# Patient Record
Sex: Female | Born: 1995 | Race: White | Hispanic: No | Marital: Single | Smoking: Never smoker
Health system: Southern US, Community
[De-identification: ages and names within clinical notes are randomized; demographics above are authoritative.]

## PROBLEM LIST (undated history)

## (undated) DIAGNOSIS — I10 Essential (primary) hypertension: Secondary | ICD-10-CM

## (undated) DIAGNOSIS — E785 Hyperlipidemia, unspecified: Secondary | ICD-10-CM

## (undated) DIAGNOSIS — G43909 Migraine, unspecified, not intractable, without status migrainosus: Secondary | ICD-10-CM

## (undated) DIAGNOSIS — F909 Attention-deficit hyperactivity disorder, unspecified type: Secondary | ICD-10-CM

## (undated) DIAGNOSIS — E119 Type 2 diabetes mellitus without complications: Secondary | ICD-10-CM

## (undated) DIAGNOSIS — E559 Vitamin D deficiency, unspecified: Secondary | ICD-10-CM

## (undated) HISTORY — DX: Migraine, unspecified, not intractable, without status migrainosus: G43.909

## (undated) HISTORY — DX: Hyperlipidemia, unspecified: E78.5

## (undated) HISTORY — DX: Type 2 diabetes mellitus without complications: E11.9

## (undated) HISTORY — PX: NO PAST SURGERIES: SHX2092

## (undated) HISTORY — DX: Vitamin D deficiency, unspecified: E55.9

---
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
## 2016-05-11 ENCOUNTER — Emergency Department (HOSPITAL_COMMUNITY)
Admission: EM | Admit: 2016-05-11 | Discharge: 2016-05-11 | Disposition: A | Discharge status: Home / Self Care | Payer: Medicaid Other | Attending: Emergency Medicine | Admitting: Emergency Medicine

## 2016-05-11 ENCOUNTER — Emergency Department (HOSPITAL_COMMUNITY): Payer: Medicaid Other

## 2016-05-11 ENCOUNTER — Encounter (HOSPITAL_COMMUNITY): Payer: Self-pay | Admitting: Emergency Medicine

## 2016-05-11 DIAGNOSIS — Z79899 Other long term (current) drug therapy: Secondary | ICD-10-CM | POA: Diagnosis not present

## 2016-05-11 DIAGNOSIS — I10 Essential (primary) hypertension: Secondary | ICD-10-CM | POA: Diagnosis not present

## 2016-05-11 DIAGNOSIS — F909 Attention-deficit hyperactivity disorder, unspecified type: Secondary | ICD-10-CM | POA: Insufficient documentation

## 2016-05-11 DIAGNOSIS — R0789 Other chest pain: Secondary | ICD-10-CM | POA: Diagnosis present

## 2016-05-11 DIAGNOSIS — R079 Chest pain, unspecified: Secondary | ICD-10-CM

## 2016-05-11 LAB — CBC
HEMATOCRIT: 38.7 % (ref 36.0–46.0)
HEMOGLOBIN: 13.1 g/dL (ref 12.0–15.0)
MCH: 30.9 pg (ref 26.0–34.0)
MCHC: 33.9 g/dL (ref 30.0–36.0)
MCV: 91.3 fL (ref 78.0–100.0)
Platelets: 279 10*3/uL (ref 150–400)
RBC: 4.24 MIL/uL (ref 3.87–5.11)
RDW: 12.8 % (ref 11.5–15.5)
WBC: 11.9 10*3/uL — ABNORMAL HIGH (ref 4.0–10.5)

## 2016-05-11 LAB — BASIC METABOLIC PANEL
ANION GAP: 8 (ref 5–15)
BUN: 15 mg/dL (ref 6–20)
CALCIUM: 9.1 mg/dL (ref 8.9–10.3)
CO2: 25 mmol/L (ref 22–32)
Chloride: 103 mmol/L (ref 101–111)
Creatinine, Ser: 0.97 mg/dL (ref 0.44–1.00)
GFR calc non Af Amer: >60 mL/min (ref >60)
Glucose, Bld: 182 mg/dL — ABNORMAL HIGH (ref 65–99)
POTASSIUM: 3.9 mmol/L (ref 3.5–5.1)
Sodium: 136 mmol/L (ref 135–145)

## 2016-05-11 LAB — TROPONIN I: Troponin I: <0.03 ng/mL (ref <0.03)

## 2016-05-11 MED ORDER — IBUPROFEN 400 MG PO TABS: 400.0000 mg | ORAL_TABLET | Freq: Four times a day (QID) | ORAL | 0 refills | Status: AC | PRN

## 2016-05-11 MED ORDER — ONDANSETRON 4 MG PO TBDP
4.0000 mg | ORAL_TABLET | Freq: Once | ORAL | Status: AC
  Administered 2016-05-11: 4 mg via ORAL
  Filled 2016-05-11: qty 1

## 2016-05-11 MED ORDER — IBUPROFEN 800 MG PO TABS
800.0000 mg | ORAL_TABLET | Freq: Once | ORAL | Status: AC
Start: 2016-05-11 — End: 2016-05-11
  Administered 2016-05-11: 800 mg via ORAL
  Filled 2016-05-11: qty 1

## 2016-05-11 NOTE — ED Provider Notes (Signed)
[facility]-EMERGENCY DEPT Provider Note   CSN: [id number] Arrival date & time: 05/11/16  [id number]     History   Chief Complaint Chief Complaint  Patient presents with  . Chest Pain    HPI Bertram SavinSantashia C Puskarich is a 20 y.o. female.  HPI  The patient is a 20 year old female, she has a history of hypertension since the age of 20 and has had chest pain since that age as well. She reports that over the last couple of days she has had increasing frequency of chest pain which is parasternal, sometimes left, sometimes right parasternal, does not seem to be worse with breathing or position, may be palpation worsening it. She has no exertional symptoms, she has never had coronary disease or heart disease, denies fevers chills nausea vomiting shortness of breath or swelling of the legs. She has been taking her medications including Norvasc as prescribed without difficulty.  Past Medical History:  Diagnosis Date  . ADHD (attention deficit hyperactivity disorder)   . Hypertension     There are no active problems to display for this patient.   History reviewed. No pertinent surgical history.  OB History    No data available       Home Medications    Prior to Admission medications   Medication Sig Start Date End Date Taking? Authorizing Provider  amLODipine (NORVASC) 10 MG tablet Take 10 mg by mouth daily.    Historical Provider, MD  cloNIDine (CATAPRES) 0.1 MG tablet Take 0.2 mg by mouth at bedtime.     Historical Provider, MD  ibuprofen (ADVIL,MOTRIN) 400 MG tablet Take 1 tablet (400 mg total) by mouth every 6 (six) hours as needed. 05/11/16   Eber HongBrian Tamorah Hada, MD  methylphenidate (CONCERTA) 54 MG CR tablet Take 54 mg by mouth every morning.    Historical Provider, MD    Family History Family History  Problem Relation Age of Onset  . Family history unknown: Yes    Social History Social History  Substance Use Topics  . Smoking status: Never Smoker  . Smokeless tobacco: Never Used  . Alcohol  use No     Allergies   Augmentin [amoxicillin-pot clavulanate]   Review of Systems Review of Systems  All other systems reviewed and are negative.    Physical Exam Updated Vital Signs BP 125/79 (BP Location: Left Arm)   Pulse 106   Temp 98.1 F (36.7 C)   Resp 18   Ht 5\' 4"  (1.626 m)   Wt 198 lb (89.8 kg)   LMP 04/10/2016   SpO2 100%   BMI 33.99 kg/m   Physical Exam  Constitutional: She appears well-developed and well-nourished. No distress.  HENT:  Head: Normocephalic and atraumatic.  Mouth/Throat: Oropharynx is clear and moist. No oropharyngeal exudate.  Eyes: Conjunctivae and EOM are normal. Pupils are equal, round, and reactive to light. Right eye exhibits no discharge. Left eye exhibits no discharge. No scleral icterus.  Neck: Normal range of motion. Neck supple. No JVD present. No thyromegaly present.  Cardiovascular: Normal rate, regular rhythm, normal heart sounds and intact distal pulses.  Exam reveals no gallop and no friction rub.   No murmur heard. Pulmonary/Chest: Effort normal and breath sounds normal. No respiratory distress. She has no wheezes. She has no rales. She exhibits tenderness ( Female chaperone present for exam, direct tenderness over this sternum and parasternal tissue).  Abdominal: Soft. Bowel sounds are normal. She exhibits no distension and no mass. There is no tenderness.  Musculoskeletal: Normal range  of motion. She exhibits no edema or tenderness.  Lymphadenopathy:    She has no cervical adenopathy.  Neurological: She is alert. Coordination normal.  Skin: Skin is warm and dry. No rash noted. No erythema.  Psychiatric: She has a normal mood and affect. Her behavior is normal.  Nursing note and vitals reviewed.    ED Treatments / Results  Labs (all labs ordered are listed, but only abnormal results are displayed) Labs Reviewed  BASIC METABOLIC PANEL - Abnormal; Notable for the following:       Result Value   Glucose, Bld 182 (*)      All other components within normal limits  CBC - Abnormal; Notable for the following:    WBC 11.9 (*)    All other components within normal limits  TROPONIN I    EKG  EKG Interpretation  Date/Time:  Monday May 11 2016 19:08:20 EDT Ventricular Rate:  102 PR Interval:    QRS Duration: 90 QT Interval:  330 QTC Calculation: 430 R Axis:   60 Text Interpretation:  Sinus tachycardia Baseline wander in lead(s) II III aVF Since last tracing rate faster Confirmed by Soley Harriss  MD, Yomar Mejorado ([id number]) on 05/11/2016 7:33:54 PM       Radiology Dg Chest 2 View  Result Date: 05/11/2016 CLINICAL DATA:  Centralized chest pressure beginning this morning and worsening throughout the day. EXAM: CHEST  2 VIEW COMPARISON:  PA and lateral chest 02/04/2010. FINDINGS: Lungs are clear. Heart size is normal. No pneumothorax or pleural effusion. No bony abnormality. IMPRESSION: Negative chest. Electronically Signed   By: Drusilla Kanner M.D.   On: 05/11/2016 20:45    Procedures Procedures (including critical care time)  Medications Ordered in ED Medications  ibuprofen (ADVIL,MOTRIN) tablet 800 mg (800 mg Oral Given 05/11/16 1934)  ondansetron (ZOFRAN-ODT) disintegrating tablet 4 mg (4 mg Oral Given 05/11/16 2010)     Initial Impression / Assessment and Plan / ED Course  I have reviewed the triage vital signs and the nursing notes.  Pertinent labs & imaging results that were available during my care of the patient were reviewed by me and considered in my medical decision making (see chart for details).  Clinical Course    The patient does have reproducible tenderness over her sternum, no other abnormal findings on exam. She has no risk factors for pulmonary embolism including travel, trauma, immobilization, swelling, birth control, smoking. She has no other risk factors for coronary disease except for hypertension which is extremely well controlled as her blood pressure is 120/80 in the room.  Labs  and xray and ecg and trop normal - motrin and home, doubt PE or ACS  Final Clinical Impressions(s) / ED Diagnoses   Final diagnoses:  Chest pain, unspecified type    New Prescriptions New Prescriptions   IBUPROFEN (ADVIL,MOTRIN) 400 MG TABLET    Take 1 tablet (400 mg total) by mouth every 6 (six) hours as needed.     Eber Hong, MD 05/11/16 2056

## 2016-05-11 NOTE — ED Triage Notes (Signed)
Pt reports centralized chest pressure that started this am, worsening through the day.

## 2016-05-11 NOTE — Discharge Instructions (Signed)

## 2018-08-30 ENCOUNTER — Encounter (INDEPENDENT_AMBULATORY_CARE_PROVIDER_SITE_OTHER): Payer: Self-pay | Admitting: Internal Medicine

## 2018-08-30 ENCOUNTER — Ambulatory Visit (INDEPENDENT_AMBULATORY_CARE_PROVIDER_SITE_OTHER): Payer: Self-pay | Admitting: Internal Medicine

## 2018-08-30 ENCOUNTER — Other Ambulatory Visit (INDEPENDENT_AMBULATORY_CARE_PROVIDER_SITE_OTHER): Payer: Self-pay | Admitting: Internal Medicine

## 2018-08-30 VITALS — BP 139/89 | HR 111 | Temp 98.0°F | Ht 64.0 in | Wt 206.5 lb

## 2018-08-30 DIAGNOSIS — K219 Gastro-esophageal reflux disease without esophagitis: Secondary | ICD-10-CM

## 2018-08-30 DIAGNOSIS — K5900 Constipation, unspecified: Secondary | ICD-10-CM

## 2018-08-30 DIAGNOSIS — K921 Melena: Secondary | ICD-10-CM

## 2018-08-30 DIAGNOSIS — R197 Diarrhea, unspecified: Secondary | ICD-10-CM

## 2018-08-30 MED ORDER — POLYETHYLENE GLYCOL 3350 17 GM/SCOOP PO POWD: 1.0000 | Freq: Every day | ORAL | 3 refills | Status: DC

## 2018-08-30 MED ORDER — OMEPRAZOLE 20 MG PO CPDR: 20.0000 mg | DELAYED_RELEASE_CAPSULE | Freq: Every day | ORAL | 3 refills | Status: DC

## 2018-08-30 NOTE — Progress Notes (Signed)
   Subjective:    Patient ID: Monica Jennings, female    DOB: [birth year], 23 y.o.   MRN: [id number]  HPI Referred by Dr. Wende Crease for loose stools, blood in stools. Has seen blood a couple of times about a month ago. Her stools are loose. Loose stools for months. She says she has constipation. Has a BM about once a week. Her symptoms started about 2 months ago. She saw blood when she strained. Has taken Miralax in the past for constipation.  She says her BMs are hard. She alternates between soft stools and constipation. She says her stomach hurts with anything she eats. Nothing for the GERD. GERD symptoms for years.  Her appetite is okay. No weight loss.  Symptoms worse when she eats dairy.  No family hx of Crohn's disease. Periods are very irregular     Review of Systems Past Medical History:  Diagnosis Date  . ADHD (attention deficit hyperactivity disorder)   . Hypertension     No past surgical history on file.  Allergies  Allergen Reactions  . Augmentin [Amoxicillin-Pot Clavulanate]     Nausea and vomiting    Current Outpatient Medications on File Prior to Visit  Medication Sig Dispense Refill  . amLODipine (NORVASC) 10 MG tablet Take 10 mg by mouth daily.    Marland Kitchen ibuprofen (ADVIL,MOTRIN) 400 MG tablet Take 1 tablet (400 mg total) by mouth every 6 (six) hours as needed. 30 tablet 0  . Multiple Vitamin (MULTIVITAMIN) tablet Take 1 tablet by mouth daily.     No current facility-administered medications on file prior to visit.         Objective:   Physical Exam Blood pressure 139/89, pulse (!) 111, temperature 98 F (36.7 C), height 5\' 4"  (1.626 m), weight 206 lb 8 oz (93.7 kg).  Alert and oriented. Skin warm and dry. Oral mucosa is moist.   . Sclera anicteric, conjunctivae is pink. Thyroid not enlarged. No cervical lymphadenopathy. Lungs clear. Heart regular rate and rhythm.  Abdomen is soft. Bowel sounds are positive. No hepatomegaly. No abdominal masses felt. No  tenderness.  No edema to lower extremities.         Assessment & Plan:  Constipation. Miralax daily. Am going to get an IBD panel. Further  Recommendations to follow.  Blood in stool while constipated. 3 stools cards home with patient.  GERD. Rx for Omeprazole sent to her pharmacy. GERD diet given to patient.

## 2018-08-30 NOTE — Patient Instructions (Signed)
IBD panel.

## 2018-09-02 LAB — INFLAM. BOWEL DISEASE DIFF. PANEL
ANCA SCREEN: NEGATIVE
SACCHAROMYCES CEREVISIAE AB (ASCA)(IGA): 6.2 U (ref <=20.0)
SACCHAROMYCES CEREVISIAE AB (ASCA)(IGG): 10.1 U (ref <=20.0)

## 2018-10-25 ENCOUNTER — Encounter (INDEPENDENT_AMBULATORY_CARE_PROVIDER_SITE_OTHER): Payer: Self-pay | Admitting: Internal Medicine

## 2018-10-25 ENCOUNTER — Ambulatory Visit (INDEPENDENT_AMBULATORY_CARE_PROVIDER_SITE_OTHER): Payer: Medicaid Other | Admitting: Internal Medicine

## 2018-10-25 ENCOUNTER — Other Ambulatory Visit: Payer: Self-pay

## 2018-10-25 VITALS — BP 118/69 | HR 94 | Temp 97.8°F | Ht 64.0 in | Wt 204.8 lb

## 2018-10-25 DIAGNOSIS — K219 Gastro-esophageal reflux disease without esophagitis: Secondary | ICD-10-CM

## 2018-10-25 DIAGNOSIS — K5900 Constipation, unspecified: Secondary | ICD-10-CM | POA: Diagnosis not present

## 2018-10-25 NOTE — Progress Notes (Signed)
   Subjective:    Patient ID: Monica Jennings, female    DOB: 1996-04-15, 23 y.o.   MRN: [id number]  HPI Here today for f/u. Last seen in January of this year as a new referral. Had been having loose stools. Had seen some blood in her stools about 2 months ago. Saw blood when she strained to have a BM. She alternates between constipation and soft stools.  At OV she said she was constipated. Having a BM x 1 a weeks. C/o GERD. Symptoms worse with dairy. No family hx of Crohn's disease. She was advised to take Miralax daily. IBD panel ordered but was not drawn. Three stools cards sent home with patient (not returned). GERD diet given to patient. Started on Omeprazole.  She tells me she is pregnant. She is 4 1/2 months pregnant.Her stools are better since starting the Miralax. She does have some problems with her constipation.  Appetite is good. GERD controlled with the Omeprazole.      Review of Systems Past Medical History:  Diagnosis Date  . ADHD (attention deficit hyperactivity disorder)   . Hypertension     History reviewed. No pertinent surgical history.  Allergies  Allergen Reactions  . Augmentin [Amoxicillin-Pot Clavulanate]     Nausea and vomiting    Current Outpatient Medications on File Prior to Visit  Medication Sig Dispense Refill  . ibuprofen (ADVIL,MOTRIN) 400 MG tablet Take 1 tablet (400 mg total) by mouth every 6 (six) hours as needed. 30 tablet 0  . labetalol (NORMODYNE) 100 MG tablet Take by mouth once.    . metFORMIN (GLUCOPHAGE) 500 MG tablet Take by mouth 3 (three) times daily.    . Multiple Vitamin (MULTIVITAMIN) tablet Take 1 tablet by mouth daily.    Marland Kitchen omeprazole (PRILOSEC) 20 MG capsule Take 1 capsule (20 mg total) by mouth daily. 90 capsule 3  . polyethylene glycol powder (GLYCOLAX/MIRALAX) powder Take 255 g by mouth daily. 255 g 3   No current facility-administered medications on file prior to visit.         Objective:   Physical Exam Blood pressure  118/69, pulse 94, temperature 97.8 F (36.6 C), height 5\' 4"  (1.626 m), weight 204 lb 12.8 oz (92.9 kg). Alert and oriented. Skin warm and dry. Oral mucosa is moist.   . Sclera anicteric, conjunctivae is pink. Thyroid not enlarged. No cervical lymphadenopathy. Lungs clear. Heart regular rate and rhythm.  Abdomen is soft. Bowel sounds are positive. No hepatomegaly. No abdominal masses felt. No tenderness.  No edema to lower extremities.          Assessment & Plan:  GERD.Continue the Omeprazole daily. Constipation: Continue the Miralax.  OV as needed.

## 2018-10-25 NOTE — Patient Instructions (Signed)
Continue present medication. OV as needed.

## 2019-06-05 ENCOUNTER — Other Ambulatory Visit: Payer: Self-pay

## 2019-06-05 ENCOUNTER — Encounter (INDEPENDENT_AMBULATORY_CARE_PROVIDER_SITE_OTHER): Payer: Self-pay | Admitting: Nurse Practitioner

## 2019-06-05 ENCOUNTER — Ambulatory Visit (INDEPENDENT_AMBULATORY_CARE_PROVIDER_SITE_OTHER): Payer: Medicaid Other | Admitting: Nurse Practitioner

## 2019-06-05 VITALS — BP 118/88 | HR 86 | Ht 64.0 in | Wt 208.6 lb

## 2019-06-05 DIAGNOSIS — E1165 Type 2 diabetes mellitus with hyperglycemia: Secondary | ICD-10-CM

## 2019-06-05 DIAGNOSIS — G43909 Migraine, unspecified, not intractable, without status migrainosus: Secondary | ICD-10-CM

## 2019-06-05 DIAGNOSIS — Z23 Encounter for immunization: Secondary | ICD-10-CM | POA: Diagnosis not present

## 2019-06-05 DIAGNOSIS — R059 Cough, unspecified: Secondary | ICD-10-CM

## 2019-06-05 DIAGNOSIS — E559 Vitamin D deficiency, unspecified: Secondary | ICD-10-CM

## 2019-06-05 DIAGNOSIS — E785 Hyperlipidemia, unspecified: Secondary | ICD-10-CM

## 2019-06-05 DIAGNOSIS — I10 Essential (primary) hypertension: Secondary | ICD-10-CM

## 2019-06-05 DIAGNOSIS — E119 Type 2 diabetes mellitus without complications: Secondary | ICD-10-CM | POA: Insufficient documentation

## 2019-06-05 DIAGNOSIS — R5383 Other fatigue: Secondary | ICD-10-CM | POA: Diagnosis not present

## 2019-06-05 DIAGNOSIS — E782 Mixed hyperlipidemia: Secondary | ICD-10-CM | POA: Insufficient documentation

## 2019-06-05 DIAGNOSIS — Z0000 Encounter for general adult medical examination without abnormal findings: Secondary | ICD-10-CM

## 2019-06-05 DIAGNOSIS — R05 Cough: Secondary | ICD-10-CM | POA: Insufficient documentation

## 2019-06-05 DIAGNOSIS — G43919 Migraine, unspecified, intractable, without status migrainosus: Secondary | ICD-10-CM

## 2019-06-05 MED ORDER — LABETALOL HCL 100 MG PO TABS: 100.0000 mg | ORAL_TABLET | Freq: Once | ORAL | 0 refills | Status: DC

## 2019-06-05 MED ORDER — LABETALOL HCL 100 MG PO TABS: 100.0000 mg | ORAL_TABLET | Freq: Once | ORAL | 1 refills | Status: DC

## 2019-06-05 MED ORDER — ALBUTEROL SULFATE HFA 108 (90 BASE) MCG/ACT IN AERS: 1.0000 | INHALATION_SPRAY | Freq: Four times a day (QID) | RESPIRATORY_TRACT | 2 refills | Status: AC | PRN

## 2019-06-05 NOTE — Assessment & Plan Note (Signed)
We will collect her blood work today including hemoglobin A1c.  Further recommendations will be made based on these results.  I also encouraged her to get an eye exam soon and to repeat this every year.  She tells me she understands.  I have sent referral for ophthalmology for this.  I also encouraged her to check her blood sugar when she feels that her sugar is low, and to check a fasting blood sugar at least once a week.  She tells me she understands.

## 2019-06-05 NOTE — Assessment & Plan Note (Signed)
Patient was administered flu shot in office today.  She will follow-up for annual physical exam as scheduled in a couple of months.

## 2019-06-05 NOTE — Assessment & Plan Note (Signed)
We will collect lipid panel today.  Patient did tell me that she was fasting today.  Further recommendations to be made based on this result.  She will follow-up in approximately 2 months.

## 2019-06-05 NOTE — Assessment & Plan Note (Signed)
We will check her serum vitamin D level today for further evaluation.  She may need to restart her vitamin D supplementation.  I will let her know based on her results.  She will follow-up as scheduled in approximately 2 months.

## 2019-06-05 NOTE — Assessment & Plan Note (Signed)
Blood pressure is acceptable today.  She will continue on current medication as prescribed.  We did discuss ACE/ARB therapy and its role in kidney protection especially as is related to her diabetes.  I did discuss that these medications are a contraindicated in pregnancy and while she is not using any contraception at this time, I recommended not starting these medications until she has reliable contraception in place.  She tells me she understands.  I also encouraged her to continue monitoring her blood pressure at home and to call the office if her systolic blood pressure is above 937 or diastolic blood pressure is above 90.  She tells me she understands.  She will follow-up as scheduled in approximately 2 months.

## 2019-06-05 NOTE — Assessment & Plan Note (Signed)
I encouraged her to try over-the-counter medication such as Excedrin to treat her headache.  She would like referral to neurology for further evaluation.  Thus I have sent a referral to neurology.  She will follow-up as scheduled in 2 months.

## 2019-06-05 NOTE — Assessment & Plan Note (Signed)
Patient was given the flu shot today in the office.  She will follow-up as scheduled for her annual physical exam as scheduled.

## 2019-06-05 NOTE — Progress Notes (Signed)
Subjective:  Patient ID: Monica Jennings, female    DOB: 1996-06-20  Age: 23 y.o. MRN: [id number]  CC:  Chief Complaint  Patient presents with  . Hypertension  . Hyperlipidemia  . other    Vitamin D Deficiency  . Diabetes      HPI  This patient presents today for a follow-up office visit for chronic conditions.  She is approximately 3 months postpartum.  She has started getting her menstrual cycles again, last menstrual cycle started yesterday.  She is not currently on any contraception however she has a follow-up scheduled with her OB/GYN and is considering Nexplanon for this.  She has a history of vitamin D deficiency.  Last serum level was collected in May 2019 and was 55.  She tells me she is not currently taking her vitamin D supplementation.  She also has a history of hypertension and is currently on labetalol.  She also has a history of type 2 diabetes and may be a candidate for ACE or ARB, however she is sexually active, has a history of unplanned pregnancy, and is not currently using any contraception at this time.  She is also had hyperlipidemia in the past.  She is not currently on any medication for this at this time.  She has a history of type 2 diabetes.  Last A1c was collected by her OB/GYN, and I do not see any records of her level.  She tells me she thinks her A1c was around 5.0.  She is currently on metformin twice a day and tolerates this well.  She does not check her blood sugar consistently.  She does sometimes have hypoglycemic events and her symptoms are feeling hot and shaky.  She  does know to check her blood sugar if she has the symptoms.  She does not see a podiatrist regularly.  She has not seen an eye doctor since she was diagnosed with diabetes which was approximately 2 and half years ago.  She also mentions to me that she has been having severe headaches over the last 3 months since giving birth to her son.  She has been monitoring her blood  pressures at home and they have been well controlled on her current medication.  She tells me the headache is about 8 out of 10 in severity, usually located above her right eye.  She does feel nauseated with them.  She tells me she will take Tylenol or ibuprofen as needed and lay down in a dark room and the headache will usually go away within 1 day.  She also mentions that she has been coughing.  She tells me she normally coughs at night, it is a dry cough.  It does wake her up from sleep.  She also expresses that she has some shortness of breath with physical exertion at times.  She denies any personal history of asthma.  Past Medical History:  Diagnosis Date  . ADHD (attention deficit hyperactivity disorder)   . Diabetes (HCC)   . Hyperlipidemia   . Hypertension   . Vitamin D deficiency       Family History  Problem Relation Age of Onset  . ADD / ADHD Brother   . Diabetes Maternal Grandfather   . Hypertension Maternal Grandfather     Social History   Social History Narrative  . Not on file     Current Meds  Medication Sig  . ibuprofen (ADVIL,MOTRIN) 400 MG tablet Take 1 tablet (400 mg total)  by mouth every 6 (six) hours as needed.  . labetalol (NORMODYNE) 100 MG tablet Take 1 tablet (100 mg total) by mouth once for 1 dose.  . metFORMIN (GLUCOPHAGE) 500 MG tablet Take 1,000 mg by mouth 2 (two) times daily with a meal.   . [DISCONTINUED] labetalol (NORMODYNE) 100 MG tablet Take 100 mg by mouth once.   . [DISCONTINUED] labetalol (NORMODYNE) 100 MG tablet Take 1 tablet (100 mg total) by mouth once for 1 dose.    ROS:  Review of Systems  Constitutional: Negative for chills and fever.  Eyes: Negative for blurred vision and double vision.  Respiratory: Positive for cough. Negative for shortness of breath and wheezing.   Cardiovascular: Negative for chest pain and palpitations.  Gastrointestinal: Negative for heartburn.  Neurological: Positive for headaches. Negative for  dizziness.     Objective:   Today's Vitals: BP 118/88   Pulse 86   Ht 5\' 4"  (1.626 m)   Wt 208 lb 9.6 oz (94.6 kg)   LMP 06/04/2019   SpO2 99%   BMI 35.81 kg/m  Vitals with BMI 06/05/2019 10/25/2018 08/30/2018  Height 5\' 4"  5\' 4"  5\' 4"   Weight 208 lbs 10 oz 204 lbs 13 oz 206 lbs 8 oz  BMI 35.79 82.99 37.16  Systolic 967 893 810  Diastolic 88 69 89  Pulse 86 94 111     Physical Exam Vitals signs reviewed.  Constitutional:      Appearance: Normal appearance.  HENT:     Head: Normocephalic and atraumatic.  Neck:     Musculoskeletal: Neck supple.     Vascular: No carotid bruit.  Cardiovascular:     Rate and Rhythm: Normal rate and regular rhythm.     Pulses: Normal pulses.          Dorsalis pedis pulses are 2+ on the right side and 2+ on the left side.     Heart sounds: Normal heart sounds.  Pulmonary:     Effort: Pulmonary effort is normal.     Breath sounds: Examination of the left-lower field reveals wheezing. Wheezing present.  Musculoskeletal:     Right foot: No deformity or foot drop.     Left foot: No deformity or foot drop.  Feet:     Right foot:     Protective Sensation: 10 sites tested. 8 sites sensed.     Skin integrity: Skin integrity normal.     Toenail Condition: Right toenails are normal.     Left foot:     Protective Sensation: 10 sites tested. 10 sites sensed.     Skin integrity: Skin integrity normal.     Toenail Condition: Left toenails are normal.  Skin:    General: Skin is warm and dry.  Neurological:     General: No focal deficit present.     Mental Status: She is alert.  Psychiatric:        Mood and Affect: Mood normal.        Behavior: Behavior normal.        Judgment: Judgment normal.          Assessment   1. Fatigue, unspecified type   2. Type 2 diabetes mellitus with hyperglycemia, without long-term current use of insulin (HCC)   3. Vitamin D deficiency   4. Hypertension, unspecified type   5. Hyperlipidemia, unspecified  hyperlipidemia type       Tests ordered Orders Placed This Encounter  Procedures  . CBC  . Lipid Panel  .  CMP  . TSH  . T3  . T4  . Hemoglobin A1c  . Microalbumin/Creatinine Ratio, Urine  . Vitamin D, 25-hydroxy     Plan: Please see assessment and plan per problem list below.   Meds ordered this encounter  Medications  . DISCONTD: labetalol (NORMODYNE) 100 MG tablet    Sig: Take 1 tablet (100 mg total) by mouth once for 1 dose.    Dispense:  1 tablet    Refill:  0    Order Specific Question:   Supervising Provider    Answer:   Karilyn Cota, NIMISH C [[id number]]  . labetalol (NORMODYNE) 100 MG tablet    Sig: Take 1 tablet (100 mg total) by mouth once for 1 dose.    Dispense:  90 tablet    Refill:  1    Order Specific Question:   Supervising Provider    Answer:   Wilson Singer [[id number]]    Patient to follow-up in 2 months as scheduled.  Elenore Paddy, NP

## 2019-06-05 NOTE — Patient Instructions (Signed)
1.  Vitamin D deficiency: We will check your vitamin D level in your blood work today.  You may need to restart your vitamin D supplements.  2.  High blood pressure: Continue taking your current blood pressure medication at this time.  Also continue checking her blood pressure at home.  Please call the office if the top number is 150 or greater more than 2 times or the bottom number is 90 or greater more than 2 times.  3.  Diabetes: Continue checking a fasting blood sugar at least once a week.  Also check your blood sugar if you have any symptoms of low blood sugar.  If blood sugar is low treat by drinking half a cup of juice and recheck blood sugar in 15 minutes.  Also will try to send referral to eye doctor for annual eye exam.  4.  Migraines: We will send referral to neurology for further evaluation.  You can try over-the-counter medications such as Excedrin as needed for headache.  5.  Cough: I have sent prescription for inhaler that she can take as needed to treat your cough or shortness of breath.  Inhale 2 puffs every 6 hours as needed.  May need to consider referral to lung doctor to evaluate for causes of your cough.  6.  Blood work: I will notify you if your blood work is abnormal, and if we need to start you on another medication such as your thyroid medication.  Follow-up with [place] as scheduled in approximately 6 weeks, or call this office with any questions or concerns prior to that appointment.

## 2019-06-06 ENCOUNTER — Telehealth (INDEPENDENT_AMBULATORY_CARE_PROVIDER_SITE_OTHER): Payer: Self-pay | Admitting: Nurse Practitioner

## 2019-06-06 ENCOUNTER — Other Ambulatory Visit (INDEPENDENT_AMBULATORY_CARE_PROVIDER_SITE_OTHER): Payer: Self-pay | Admitting: Nurse Practitioner

## 2019-06-06 DIAGNOSIS — E1165 Type 2 diabetes mellitus with hyperglycemia: Secondary | ICD-10-CM

## 2019-06-06 DIAGNOSIS — E785 Hyperlipidemia, unspecified: Secondary | ICD-10-CM

## 2019-06-06 LAB — T4: T4, Total: 10.8 ug/dL (ref 5.1–11.9)

## 2019-06-06 LAB — COMPREHENSIVE METABOLIC PANEL
AG Ratio: 1.6 (calc) (ref 1.0–2.5)
ALT: 36 U/L — ABNORMAL HIGH (ref 6–29)
AST: 32 U/L — ABNORMAL HIGH (ref 10–30)
Albumin: 4.5 g/dL (ref 3.6–5.1)
Alkaline phosphatase (APISO): 57 U/L (ref 31–125)
BUN: 8 mg/dL (ref 7–25)
CO2: 24 mmol/L (ref 20–32)
Calcium: 9.2 mg/dL (ref 8.6–10.2)
Chloride: 104 mmol/L (ref 98–110)
Creat: 0.7 mg/dL (ref 0.50–1.10)
Globulin: 2.8 g/dL (calc) (ref 1.9–3.7)
Glucose, Bld: 117 mg/dL — ABNORMAL HIGH (ref 65–99)
Potassium: 4.1 mmol/L (ref 3.5–5.3)
Sodium: 139 mmol/L (ref 135–146)
Total Bilirubin: 0.3 mg/dL (ref 0.2–1.2)
Total Protein: 7.3 g/dL (ref 6.1–8.1)

## 2019-06-06 LAB — CBC
HCT: 39.7 % (ref 35.0–45.0)
Hemoglobin: 13.6 g/dL (ref 11.7–15.5)
MCH: 29.6 pg (ref 27.0–33.0)
MCHC: 34.3 g/dL (ref 32.0–36.0)
MCV: 86.5 fL (ref 80.0–100.0)
MPV: 11.8 fL (ref 7.5–12.5)
Platelets: 313 10*3/uL (ref 140–400)
RBC: 4.59 10*6/uL (ref 3.80–5.10)
RDW: 13.9 % (ref 11.0–15.0)
WBC: 9.4 10*3/uL (ref 3.8–10.8)

## 2019-06-06 LAB — LIPID PANEL
Cholesterol: 116 mg/dL (ref <200)
HDL: 30 mg/dL — ABNORMAL LOW (ref >=50)
LDL Cholesterol (Calc): 51 mg/dL (calc)
Non-HDL Cholesterol (Calc): 86 mg/dL (calc) (ref <130)
Total CHOL/HDL Ratio: 3.9 (calc) (ref <5.0)
Triglycerides: 330 mg/dL — ABNORMAL HIGH (ref <150)

## 2019-06-06 LAB — HEMOGLOBIN A1C
Hgb A1c MFr Bld: 7.4 % of total Hgb — ABNORMAL HIGH (ref <5.7)
Mean Plasma Glucose: 166 (calc)
eAG (mmol/L): 9.2 (calc)

## 2019-06-06 LAB — MICROALBUMIN / CREATININE URINE RATIO
Creatinine, Urine: 209 mg/dL (ref 20–275)
Microalb Creat Ratio: 121 mcg/mg creat — ABNORMAL HIGH (ref <30)
Microalb, Ur: 25.2 mg/dL

## 2019-06-06 LAB — TSH: TSH: 2.68 mIU/L

## 2019-06-06 LAB — VITAMIN D 25 HYDROXY (VIT D DEFICIENCY, FRACTURES): Vit D, 25-Hydroxy: 20 ng/mL — ABNORMAL LOW (ref 30–100)

## 2019-06-06 LAB — T3: T3, Total: 169 ng/dL (ref 76–181)

## 2019-06-06 MED ORDER — METFORMIN HCL 1000 MG PO TABS: 1000.0000 mg | ORAL_TABLET | Freq: Two times a day (BID) | ORAL | 1 refills | Status: DC

## 2019-06-06 MED ORDER — GLIPIZIDE 5 MG PO TABS: 2.5000 mg | ORAL_TABLET | Freq: Every day | ORAL | 1 refills | Status: DC

## 2019-06-06 NOTE — Telephone Encounter (Signed)
I did speak to this patient over the phone regarding her lab results. We discussed making lifestyle changes such as eating a healthier diet focused on whole foods and avoidance of processed carbohydrates. We also discussed medication changes including adding a omega-3 supplement daily, restarting vitamin D daily, and adding low-dose glipizide. We will hold off on adding ACE/ARB, statin, or other diabetic agents until patient is on effective contraception. In addition, I encouraged her to check a fasting blood sugar every day for at least the next 2 weeks. We also again discussed hypoglycemia and how to treat this if this occurs on her new medication. I told her if she experiences 2 or more hypoglycemic events between now and her next appointment that she is to call this office before her next appointment. She tells me she understands. She was encouraged to follow-up as scheduled, but to call the office any questions or concerns before her next appointment.

## 2019-06-06 NOTE — Progress Notes (Signed)
I did speak to this patient over the phone regarding her lab results. We discussed making lifestyle changes such as eating a healthier diet focused on whole foods and avoidance of processed carbohydrates. We also discussed medication changes including adding a omega-3 supplement daily, restarting vitamin D daily, and adding low-dose glipizide. We will hold off on adding ACE/ARB, statin, or other diabetic agents until patient is on effective contraception. In addition, I encouraged her to check a fasting blood sugar every day for at least the next 2 weeks. We also again discussed hypoglycemia and how to treat this if this occurs on her new medication. I told her if she experiences 2 or more hypoglycemic events between now and her next appointment that she is to call this office before her next appointment. She tells me she understands. She was encouraged to follow-up as scheduled, but to call the office any questions or concerns before her next appointment. 

## 2019-07-11 ENCOUNTER — Encounter (INDEPENDENT_AMBULATORY_CARE_PROVIDER_SITE_OTHER): Payer: Self-pay | Admitting: Nurse Practitioner

## 2019-07-11 ENCOUNTER — Ambulatory Visit (INDEPENDENT_AMBULATORY_CARE_PROVIDER_SITE_OTHER): Payer: Medicaid Other | Admitting: Nurse Practitioner

## 2019-07-11 ENCOUNTER — Other Ambulatory Visit: Payer: Self-pay

## 2019-07-11 VITALS — BP 128/88 | HR 117 | Temp 97.1°F | Resp 14 | Ht 64.0 in | Wt 212.0 lb

## 2019-07-11 DIAGNOSIS — E1165 Type 2 diabetes mellitus with hyperglycemia: Secondary | ICD-10-CM

## 2019-07-11 DIAGNOSIS — R000 Tachycardia, unspecified: Secondary | ICD-10-CM | POA: Insufficient documentation

## 2019-07-11 DIAGNOSIS — K219 Gastro-esophageal reflux disease without esophagitis: Secondary | ICD-10-CM | POA: Diagnosis not present

## 2019-07-11 DIAGNOSIS — R1032 Left lower quadrant pain: Secondary | ICD-10-CM | POA: Diagnosis not present

## 2019-07-11 DIAGNOSIS — R109 Unspecified abdominal pain: Secondary | ICD-10-CM | POA: Diagnosis not present

## 2019-07-11 DIAGNOSIS — E785 Hyperlipidemia, unspecified: Secondary | ICD-10-CM | POA: Diagnosis not present

## 2019-07-11 DIAGNOSIS — Z0001 Encounter for general adult medical examination with abnormal findings: Secondary | ICD-10-CM | POA: Diagnosis not present

## 2019-07-11 DIAGNOSIS — R12 Heartburn: Secondary | ICD-10-CM | POA: Insufficient documentation

## 2019-07-11 DIAGNOSIS — R05 Cough: Secondary | ICD-10-CM

## 2019-07-11 DIAGNOSIS — E559 Vitamin D deficiency, unspecified: Secondary | ICD-10-CM

## 2019-07-11 DIAGNOSIS — R059 Cough, unspecified: Secondary | ICD-10-CM

## 2019-07-11 LAB — U/A NO MICRO (81003)
Bilirubin (Urine): NEGATIVE
Blood, UA: NEGATIVE
Glucose: POSITIVE — AB
Ketones, UA: 5 — AB
Leukocytes,UA: NEGATIVE
Nitrites, Initial: NEGATIVE
Protein Urine Random: 100 — AB
Specific Gravity, UA: 1.015 (ref 1.005–1.030)
Urobilinogen, UA: 0.2 E.U./dL
pH, Initial: 5

## 2019-07-11 LAB — PREGNANCY, URINE: Preg Test, Ur: NEGATIVE

## 2019-07-11 LAB — GLUCOSE, CAPILLARY: POC Glucose: 318 mg/dl — AB (ref 70–99)

## 2019-07-11 MED ORDER — CIPROFLOXACIN HCL 750 MG PO TABS: 750.0000 mg | ORAL_TABLET | Freq: Two times a day (BID) | ORAL | 0 refills | Status: DC

## 2019-07-11 MED ORDER — METRONIDAZOLE 500 MG PO TABS: 500.0000 mg | ORAL_TABLET | Freq: Four times a day (QID) | ORAL | 0 refills | Status: DC

## 2019-07-11 MED ORDER — FAMOTIDINE 20 MG PO TABS: 20.0000 mg | ORAL_TABLET | Freq: Two times a day (BID) | ORAL | 3 refills | Status: DC

## 2019-07-11 NOTE — Progress Notes (Signed)
Subjective:  Patient ID: Monica Jennings, female    DOB: 05/23/1996  Age: 23 y.o. MRN: [id number]  CC:  Chief Complaint  Patient presents with  . Annual Exam      HPI  This patient comes in today for her annual physical exam.  We discussed her health maintenance.  She had her flu shot already this flu season.  She is not sure if she had a tetanus shot, however she had a baby this last year and she most likely received her tetanus shot from her OB/GYN.  She is due for cervical cancer screening.  She does not take a folic acid supplementation.  She declined to have sexually transmitted infection screening completed today.  She does not smoke.  She is due for depression screening.  At her last office visit we did routine blood work.  This showed hypertriglyceridemia, vitamin D deficiency, and A1c of 7.4.  At that time I recommended that she start omega-3 supplementation, restart her vitamin D supplement, and started her on glipizide 2.5 mg daily in addition to her daily Metformin.  Since that visit she tells me she has made these changes and is taking her medications as recommended and prescribed.  She also mentioned to me a dry cough at her last office visit.  She is prescribed albuterol inhaler that she can take as needed.  She tells me she used that for about 2 weeks and the cough has seemed to improve on its own.  She no longer is experiencing dry cough, shortness of breath, chest tightness, or wheezing.  She does mention to me today that she has been having some heartburn.  She tells me she had heartburn while she was pregnant but the last 2 days has been experiencing similar symptoms.  She is wondering if there is something that she can take to control this.  She also mentions that she has been eating more spicy food over the last couple of days.  She also mentions that she has been having loose stools for the last 2 weeks.  She tells me she is having 3-4 loose stools a day.  She  does experience some sharp, "bubbling" pain in her abdomen prior to her loose stools.  She has not noticed any eliciting factors.  She does have mild nausea but denies any vomiting or mucus/blood in her stool.   Past Medical History:  Diagnosis Date  . ADHD (attention deficit hyperactivity disorder)   . Diabetes ([place])   . Hyperlipidemia   . Hypertension   . Migraines   . Vitamin D deficiency     Health Maintenance  Topic Date Due  . PNEUMOCOCCAL POLYSACCHARIDE VACCINE AGE 58-64 HIGH RISK  08/24/1998  . FOOT EXAM  08/24/2006  . OPHTHALMOLOGY EXAM  08/24/2006  . PAP SMEAR-Modifier  07/11/2019 (Originally 08/24/2017)  . PAP-Cervical Cytology Screening  07/10/2020 (Originally 08/24/2017)  . HIV Screening  07/10/2020 (Originally 08/25/2011)  . HEMOGLOBIN A1C  12/04/2019  . URINE MICROALBUMIN  06/04/2020  . TETANUS/TDAP  11/28/2028  . INFLUENZA VACCINE  Completed     Family History  Problem Relation Age of Onset  . ADD / ADHD Brother   . Diabetes Maternal Grandfather   . Hypertension Maternal Grandfather     Social History   Social History Narrative  . Not on file   Social History   Tobacco Use  . Smoking status: Never Smoker  . Smokeless tobacco: Never Used  Substance Use  Topics  . Alcohol use: No     Current Meds  Medication Sig  . albuterol (VENTOLIN HFA) 108 (90 Base) MCG/ACT inhaler Inhale 1-2 puffs into the lungs every 6 (six) hours as needed for wheezing or shortness of breath (cough).  . cholecalciferol (VITAMIN D3) 25 MCG (1000 UT) tablet Take 5,000 Units by mouth daily.  Marland Kitchen glipiZIDE (GLUCOTROL) 5 MG tablet Take 0.5 tablets (2.5 mg total) by mouth daily before breakfast.  . ibuprofen (ADVIL,MOTRIN) 400 MG tablet Take 1 tablet (400 mg total) by mouth every 6 (six) hours as needed.  . metFORMIN (GLUCOPHAGE) 1000 MG tablet Take 1 tablet (1,000 mg total) by mouth 2 (two) times daily with a meal.  . omega-3 acid ethyl esters (LOVAZA) 1 g capsule Take 1,000 mg by  mouth daily.    ROS:  Review of Systems  Constitutional: Negative for chills and fever.  HENT: Negative for congestion, hearing loss, sore throat and tinnitus.   Respiratory: Negative for cough, shortness of breath and wheezing.   Cardiovascular: Negative for chest pain and palpitations.  Gastrointestinal: Positive for diarrhea and nausea. Negative for blood in stool and vomiting.       (-) mucus in stool  Genitourinary: Negative for dysuria and hematuria.  Musculoskeletal: Negative for back pain, joint pain, myalgias and neck pain.  Neurological: Positive for headaches. Negative for dizziness, sensory change and weakness.  Psychiatric/Behavioral: Negative for depression and suicidal ideas.     Objective:   Today's Vitals: BP 128/88   Pulse (!) 117   Temp (!) 97.1 F (36.2 C)   Resp 14   Ht 5\' 4"  (1.626 m)   Wt 212 lb (96.2 kg)   LMP 06/04/2019   SpO2 98%   BMI 36.39 kg/m  Vitals with BMI 07/11/2019 06/05/2019 10/25/2018  Height 5\' 4"  5\' 4"  5\' 4"   Weight 212 lbs 208 lbs 10 oz 204 lbs 13 oz  BMI 36.37 35.79 35.14  Systolic 128 118 10/27/2018  Diastolic 88 88 69  Pulse 117 86 94     Physical Exam Vitals signs reviewed.  Constitutional:      Appearance: Normal appearance.  HENT:     Head: Normocephalic and atraumatic.     Right Ear: Tympanic membrane, ear canal and external ear normal.     Left Ear: Tympanic membrane, ear canal and external ear normal.  Eyes:     General:        Right eye: No discharge.        Left eye: No discharge.     Extraocular Movements: Extraocular movements intact.     Conjunctiva/sclera: Conjunctivae normal.     Pupils: Pupils are equal, round, and reactive to light.  Neck:     Musculoskeletal: Neck supple. No muscular tenderness.     Vascular: No carotid bruit.  Cardiovascular:     Rate and Rhythm: Regular rhythm.     Pulses: Normal pulses.     Heart sounds: Normal heart sounds. No murmur.     Comments: Patient mildly tachycardic on exam.   She did tell me she had to rush to get to this appointment and was worried she would not make it on time.   Pulmonary:     Effort: Pulmonary effort is normal.     Breath sounds: Normal breath sounds.  Chest:     Breasts: Breasts are symmetrical.        Right: Normal.        Left: Normal.  Abdominal:  General: Abdomen is flat. Bowel sounds are normal. There is no distension.     Palpations: Abdomen is soft. There is no mass.     Tenderness: There is abdominal tenderness. There is left CVA tenderness. There is no rebound.  Musculoskeletal:        General: No tenderness.     Right lower leg: No edema.     Left lower leg: No edema.  Lymphadenopathy:     Cervical: No cervical adenopathy.     Upper Body:     Right upper body: No supraclavicular adenopathy.     Left upper body: No supraclavicular adenopathy.  Skin:    General: Skin is warm and dry.  Neurological:     General: No focal deficit present.     Mental Status: She is alert and oriented to person, place, and time.     Motor: No weakness.     Gait: Gait normal.  Psychiatric:        Mood and Affect: Mood normal.        Behavior: Behavior normal.        Judgment: Judgment normal.          Assessment   1. Gastroesophageal reflux disease without esophagitis   2. Encounter for general adult medical examination with abnormal findings   3. Abdominal pain, unspecified abdominal location   4. Type 2 diabetes mellitus with hyperglycemia, without long-term current use of insulin (HCC)   5. Hyperlipidemia, unspecified hyperlipidemia type   6. Cough   7. Heartburn   8. Left lower quadrant abdominal pain   9. Tachycardia   10. Vitamin D deficiency       Tests ordered No orders of the defined types were placed in this encounter.    Plan: Please see assessment and plan per problem list below.   Meds ordered this encounter  Medications  . famotidine (PEPCID) 20 MG tablet    Sig: Take 1 tablet (20 mg total) by  mouth 2 (two) times daily.    Dispense:  60 tablet    Refill:  3    Order Specific Question:   Supervising Provider    Answer:   Lilly CoveGOSRANI, NIMISH C [[id number]]  . ciprofloxacin (CIPRO) 750 MG tablet    Sig: Take 1 tablet (750 mg total) by mouth 2 (two) times daily.    Dispense:  14 tablet    Refill:  0    Order Specific Question:   Supervising Provider    Answer:   Lilly CoveGOSRANI, NIMISH C [[id number]]  . metroNIDAZOLE (FLAGYL) 500 MG tablet    Sig: Take 1 tablet (500 mg total) by mouth 4 (four) times daily.    Dispense:  21 tablet    Refill:  0    Order Specific Question:   Supervising Provider    Answer:   Wilson SingerGOSRANI, NIMISH C [[id number]]    Patient to follow-up in 3 months, but as stated in assessment and plans in problem list below she was encouraged to call the office if her abdominal pain worsens, and with any other concerns or questions prior to this next upcoming appointment.  In addition to performing her annual physical exam I also performed an office visit to address her concerns today.  Elenore PaddySARAH E [place], NP

## 2019-07-11 NOTE — Assessment & Plan Note (Signed)
Not sure of current etiology of patient's abdominal pain and loose stools.  UA was negative for UTI, pregnancy test was negative.  Current differential includes viral gastroenteritis, diverticulitis, irritable bowel syndrome.  I will treat empirically with a course of antibiotics, but told the patient if her symptoms worsen or do not improve she needs to notify this office and we will consider sending her for imaging for further evaluation.  She tells me she understands.

## 2019-07-11 NOTE — Assessment & Plan Note (Signed)
Patient was mildly tachycardic on exam today she did tell me she felt like she had to rush to get to the office today.  This may be contributing factor.  She also is not sure if she is still taking her labetalol.  I told her to check her medications when she gets home and if she does not have labetalol then we will need to represcribe this.  We will follow this closely.

## 2019-07-11 NOTE — Assessment & Plan Note (Signed)
This seems to have resolved.  She was told to call the office if her symptoms return or worsen.  She tells me she understands.

## 2019-07-11 NOTE — Assessment & Plan Note (Signed)
She is most likely up-to-date with immunizations.  She had the flu shot completed in October of this year.  She is not sure she had her tetanus shot, but she most likely had this with her most recent pregnancy.  I recommended she call her OB/GYN to determine whether or not she had this.  She tells me she understands.  She is probably due for Pap smear if she did not undergo Pap smear but her most recent pregnancy, and I encouraged her to call her OB/GYN to have this scheduled.  She tells me she will.  We also discussed folic acid supplementation for the prevention of neural tube defects in a woman of childbearing age as she is not on any contraceptive and is currently sexually active.  She tells me she will consider starting the supplementation.  She declined to have sexual transmitted infection screening completed today.  She is not a smoker.  Depression screening was negative today.  She will follow-up in 1 year for her next annual physical exam.

## 2019-07-11 NOTE — Assessment & Plan Note (Signed)
Patient was not fasting at office visit today.  She was encouraged to continue taking her omega-3 supplement.  She was told to come to her next office visit fasting at which point we can can repeat lipid panel to see if her triglycerides have improved since starting omega-3 supplement.

## 2019-07-11 NOTE — Patient Instructions (Addendum)
Thank you for choosing [facility] as your medical provider! If you have any questions or concerns regarding your health care, please do not hesitate to call our office.  Some things that we talked about today: 1.  Immunizations: When you speak to your OB/GYN about scheduling Pap smear ask them if you received a tetanus vaccine during her last pregnancy.  2.  Health maintenance: Call your OB/GYN to schedule cervical cancer screening via Pap smear.  Consider taking a daily folic acid supplement, look for this over-the-counter.  You can choose either 400 or 800 mcg daily.  Take 1 folic acid supplement daily for prevention of neural tube defects in subsequent pregnancies.  Your screening for depression today was negative.  Let me know if your mood becomes depressed.  3.  High triglycerides: Continue on your omega-3 supplement.  We will check triglycerides at next office visit or at lab visit prior to her next office visit.  Make sure you are fasting when you come to have your blood drawn.  4.  Vitamin D deficiency: Continue on your vitamin D supplement.  We will check this level in your blood work at next visit.  5.  Type 2 diabetes: Please check your blood sugar especially fasting regularly and keep a log.  Also check your blood sugar if you feel that you have low blood sugar and treat low blood sugar by eating a snack or drinking 4 ounces of a sugary beverage.  Then check blood sugar again in 15 minutes to make sure your blood sugar is improving.  6.  Cough: Let me know if your cough comes back or persist.  We may need to consider seeing a lung specialist for further evaluation.  7. Abdominal Pain: I have prescribed some antibiotics. If your pain is not improving or if it starts to worsen over the next week call this office and we will send you for imaging.  8.  Heartburn: I have prescribed Pepcid (famotidine) to treat your heartburn.  You can take this twice a day as needed.  If your  symptoms do not improve or worsen, please call this office.  Please follow-up as scheduled in 3 months. We look forward to seeing you again soon! Have a great birthday and holiday season!!  At [facility] we value your feedback. You may receive a survey about your visit today. Please share your experience as we strive to create trusting relationships with our patients to provide genuine, compassionate, quality care.  We appreciate your understanding and patience as we review any laboratory studies, imaging, and other diagnostic tests that are ordered as we care for you. We do our best to address any and all results in a timely manner. If you do not hear about test results within 1 week, please do not hesitate to contact us. If we referred you to a specialist during your visit or ordered imaging testing, contact the office if you have not been contacted to be scheduled within 1 weeks.  We also encourage the use of MyChart, which contains your medical information for your review as well. If you are not enrolled in this feature, an access code is on this after visit summary for your convenience. Thank you for allowing us to be involved in your care.    Health Maintenance, Female Adopting a healthy lifestyle and getting preventive care are important in promoting health and wellness. Ask your health care provider about:  The right schedule for you to have  regular tests and exams.  Things you can do on your own to prevent diseases and keep yourself healthy. What should I know about diet, weight, and exercise? Eat a healthy diet   Eat a diet that includes plenty of vegetables, fruits, low-fat dairy products, and lean protein.  Do not eat a lot of foods that are high in solid fats, added sugars, or sodium. Maintain a healthy weight Body mass index (BMI) is used to identify weight problems. It estimates body fat based on height and weight. Your health care provider can help determine your  BMI and help you achieve or maintain a healthy weight. Get regular exercise Get regular exercise. This is one of the most important things you can do for your health. Most adults should:  Exercise for at least 150 minutes each week. The exercise should increase your heart rate and make you sweat (moderate-intensity exercise).  Do strengthening exercises at least twice a week. This is in addition to the moderate-intensity exercise.  Spend less time sitting. Even light physical activity can be beneficial. Watch cholesterol and blood lipids Have your blood tested for lipids and cholesterol at 22 years of age, then have this test every 5 years. Have your cholesterol levels checked more often if:  Your lipid or cholesterol levels are high.  You are older than 22 years of age.  You are at high risk for heart disease. What should I know about cancer screening? Depending on your health history and family history, you may need to have cancer screening at various ages. This may include screening for:  Breast cancer.  Cervical cancer.  Colorectal cancer.  Skin cancer.  Lung cancer. What should I know about heart disease, diabetes, and high blood pressure? Blood pressure and heart disease  High blood pressure causes heart disease and increases the risk of stroke. This is more likely to develop in people who have high blood pressure readings, are of African descent, or are overweight.  Have your blood pressure checked: ? Every 3-5 years if you are 71-70 years of age. ? Every year if you are 62 years old or older. Diabetes Have regular diabetes screenings. This checks your fasting blood sugar level. Have the screening done:  Once every three years after age 81 if you are at a normal weight and have a low risk for diabetes.  More often and at a younger age if you are overweight or have a high risk for diabetes. What should I know about preventing infection? Hepatitis B If you have a  higher risk for hepatitis B, you should be screened for this virus. Talk with your health care provider to find out if you are at risk for hepatitis B infection. Hepatitis C Testing is recommended for:  Everyone born from [birth year] through 1964.  Anyone with known risk factors for hepatitis C. Sexually transmitted infections (STIs)  Get screened for STIs, including gonorrhea and chlamydia, if: ? You are sexually active and are younger than 22 years of age. ? You are older than 22 years of age and your health care provider tells you that you are at risk for this type of infection. ? Your sexual activity has changed since you were last screened, and you are at increased risk for chlamydia or gonorrhea. Ask your health care provider if you are at risk.  Ask your health care provider about whether you are at high risk for HIV. Your health care provider may recommend a prescription medicine to help prevent  HIV infection. If you choose to take medicine to prevent HIV, you should first get tested for HIV. You should then be tested every 3 months for as long as you are taking the medicine. Pregnancy  If you are about to stop having your period (premenopausal) and you may become pregnant, seek counseling before you get pregnant.  Take 400 to 800 micrograms (mcg) of folic acid every day if you become pregnant.  Ask for birth control (contraception) if you want to prevent pregnancy. Osteoporosis and menopause Osteoporosis is a disease in which the bones lose minerals and strength with aging. This can result in bone fractures. If you are 71 years old or older, or if you are at risk for osteoporosis and fractures, ask your health care provider if you should:  Be screened for bone loss.  Take a calcium or vitamin D supplement to lower your risk of fractures.  Be given hormone replacement therapy (HRT) to treat symptoms of menopause. Follow these instructions at home: Lifestyle  Do not use any products  that contain nicotine or tobacco, such as cigarettes, e-cigarettes, and chewing tobacco. If you need help quitting, ask your health care provider.  Do not use street drugs.  Do not share needles.  Ask your health care provider for help if you need support or information about quitting drugs. Alcohol use  Do not drink alcohol if: ? Your health care provider tells you not to drink. ? You are pregnant, may be pregnant, or are planning to become pregnant.  If you drink alcohol: ? Limit how much you use to 0-1 drink a day. ? Limit intake if you are breastfeeding.  Be aware of how much alcohol is in your drink. In the [place] one drink equals one 12 oz bottle of beer (355 mL), one 5 oz glass of wine (148 mL), or one 1 oz glass of hard liquor (44 mL). General instructions  Schedule regular health, dental, and eye exams.  Stay current with your vaccines.  Tell your health care provider if: ? You often feel depressed. ? You have ever been abused or do not feel safe at home. Summary  Adopting a healthy lifestyle and getting preventive care are important in promoting health and wellness.  Follow your health care provider's instructions about healthy diet, exercising, and getting tested or screened for diseases.  Follow your health care provider's instructions on monitoring your cholesterol and blood pressure. This information is not intended to replace advice given to you by your health care provider. Make sure you discuss any questions you have with your health care provider. Document Released: 02/09/2011 Document Revised: 07/20/2018 Document Reviewed: 07/20/2018 Elsevier Patient Education  2020 [employer].  American Heart Association [facility]) Exercise Recommendation  Being physically active is important to prevent heart disease and stroke, the nation's No. 1and No. 5killers. To improve overall cardiovascular health, we suggest at least 150 minutes per week of moderate exercise or 75  minutes per week of vigorous exercise (or a combination of moderate and vigorous activity). Thirty minutes a day, five times a week is an easy goal to remember. You will also experience benefits even if you divide your time into two or three segments of 10 to 15 minutes per day.  For people who would benefit from lowering their blood pressure or cholesterol, we recommend 40 minutes of aerobic exercise of moderate to vigorous intensity three to four times a week to lower the risk for heart attack and stroke.  Physical activity  is anything that makes you move your body and burn calories.  This includes things like climbing stairs or playing sports. Aerobic exercises benefit your heart, and include walking, jogging, swimming or biking. Strength and stretching exercises are best for overall stamina and flexibility.  The simplest, positive change you can make to effectively improve your heart health is to start walking. It's enjoyable, free, easy, social and great exercise. A walking program is flexible and boasts high success rates because people can stick with it. It's easy for walking to become a regular and satisfying part of life.   For Overall Cardiovascular Health:  At least 30 minutes of moderate-intensity aerobic activity at least 5 days per week for a total of 150  OR   At least 25 minutes of vigorous aerobic activity at least 3 days per week for a total of 75 minutes; or a combination of moderate- and vigorous-intensity aerobic activity  AND   Moderate- to high-intensity muscle-strengthening activity at least 2 days per week for additional health benefits.  For Lowering Blood Pressure and Cholesterol  An average 40 minutes of moderate- to vigorous-intensity aerobic activity 3 or 4 times per week  What if I can't make it to the time goal? Something is always better than nothing! And everyone has to start somewhere. Even if you've been sedentary for years, today is the day you can  begin to make healthy changes in your life. If you don't think you'll make it for 30 or 40 minutes, set a reachable goal for today. You can work up toward your overall goal by increasing your time as you get stronger. Don't let all-or-nothing thinking rob you of doing what you can every day.  Source:http://www.heart.org

## 2019-07-11 NOTE — Assessment & Plan Note (Signed)
She will continue on her vitamin D supplementation as prescribed.  We will consider collecting vitamin D levels at her next office visit when we do blood work.

## 2019-07-11 NOTE — Assessment & Plan Note (Signed)
I have prescribed famotidine which she can take as needed up to twice a day.  She was encouraged to avoid any foods that can trigger her heartburn.  If her symptoms do not improve or worsen may need to consider referral to gastroenterology.  She tells me she understands.

## 2019-07-11 NOTE — Assessment & Plan Note (Addendum)
Because patient's UA was showing glucose and ketones in her urine CBG was collected today in the office.  Her blood sugar was 318.  She did tell me that she had pasta prior to this appointment.  I encouraged her to continue taking her medications as prescribed as well as to adhere to a lower carbohydrate diet and to check her blood sugars at home regularly.  I again discussed how to treat hypoglycemia if this were to occur as well.  She tells me she understands.  We will need to monitor this closely and will collect A1c at her next office visit.

## 2019-07-13 ENCOUNTER — Ambulatory Visit (INDEPENDENT_AMBULATORY_CARE_PROVIDER_SITE_OTHER): Payer: Medicaid Other | Admitting: Internal Medicine

## 2019-07-13 ENCOUNTER — Encounter (INDEPENDENT_AMBULATORY_CARE_PROVIDER_SITE_OTHER): Payer: Medicaid Other | Admitting: Internal Medicine

## 2019-08-14 ENCOUNTER — Ambulatory Visit: Payer: Medicaid Other | Admitting: Neurology

## 2019-09-13 ENCOUNTER — Ambulatory Visit (INDEPENDENT_AMBULATORY_CARE_PROVIDER_SITE_OTHER): Payer: Medicaid Other | Admitting: Nurse Practitioner

## 2019-09-26 ENCOUNTER — Ambulatory Visit (INDEPENDENT_AMBULATORY_CARE_PROVIDER_SITE_OTHER): Payer: Medicaid Other | Admitting: Nurse Practitioner

## 2019-09-26 ENCOUNTER — Encounter (INDEPENDENT_AMBULATORY_CARE_PROVIDER_SITE_OTHER): Payer: Self-pay | Admitting: Nurse Practitioner

## 2019-09-26 ENCOUNTER — Other Ambulatory Visit: Payer: Self-pay

## 2019-09-26 VITALS — BP 110/75 | HR 94 | Temp 97.9°F | Ht 64.0 in | Wt 205.8 lb

## 2019-09-26 DIAGNOSIS — E1165 Type 2 diabetes mellitus with hyperglycemia: Secondary | ICD-10-CM | POA: Diagnosis not present

## 2019-09-26 DIAGNOSIS — R350 Frequency of micturition: Secondary | ICD-10-CM

## 2019-09-26 DIAGNOSIS — E785 Hyperlipidemia, unspecified: Secondary | ICD-10-CM | POA: Diagnosis not present

## 2019-09-26 DIAGNOSIS — I10 Essential (primary) hypertension: Secondary | ICD-10-CM | POA: Diagnosis not present

## 2019-09-26 DIAGNOSIS — E559 Vitamin D deficiency, unspecified: Secondary | ICD-10-CM

## 2019-09-26 MED ORDER — LABETALOL HCL 100 MG PO TABS: 100.0000 mg | ORAL_TABLET | Freq: Every day | ORAL | 0 refills | Status: DC

## 2019-09-26 MED ORDER — GLIPIZIDE 5 MG PO TABS: 5.0000 mg | ORAL_TABLET | Freq: Two times a day (BID) | ORAL | 3 refills | Status: DC

## 2019-09-26 NOTE — Assessment & Plan Note (Signed)
We will collect lipid panel for further evaluation today. 

## 2019-09-26 NOTE — Progress Notes (Signed)
Subjective:  Patient ID: Monica Jennings, female    DOB: 04/15/1996  Age: 24 y.o. MRN: [id number]  CC:  Chief Complaint  Patient presents with  . Diabetes  . Other    tachycardia, vitamin D deficiency  . Hyperlipidemia      HPI  This patient arrives today for follow-up of the above.  Diabetes: This patient has a history of type 2 diabetes.  She continues on Metformin 1000 mg twice a day as well as glipizide 2-1/2 mg daily.  She tells me that she does not check her blood sugar regularly at home but when she does it seems to have been running high.  She tells me today her blood sugar was over 300 this morning.  She tells me she has seen as high as 500, but this was only 1 time.  She does tell me she has intermittent blurry vision and has urinary frequency.  She is concerned she may have a urinary tract infection.  She denies any dysuria, hematuria, or fevers.  She denies any vomiting, but does say she has frequent nausea.  She denies any nausea at this moment.  Last A1c was collected in December 2020 and it was 7.4.  She tells me she is now using an IUD for contraception.  This was placed approximately 3 weeks ago.   Tachycardia: Heart rate was tachycardic at her last office visit.  I was not sure she was taking labetalol as prescribed.  She tells me today that she is taking her labetalol in fact is due for a refill.  Vitamin D deficiency: She continues on vitamin D3 supplement.  She takes 5000 IUs daily.  Last serum level was collected in October 2020 and it was 20.  Hyperlipidemia: She also has a history of elevated triglycerides.  She tells me she is now restart her omega-3 supplement.      Past Medical History:  Diagnosis Date  . ADHD (attention deficit hyperactivity disorder)   . Diabetes ([place])   . Hyperlipidemia   . Hypertension   . Migraines   . Vitamin D deficiency       Family History  Problem Relation Age of Onset  . ADD / ADHD Brother   . Diabetes Maternal  Grandfather   . Hypertension Maternal Grandfather     Social History   Social History Narrative  . Not on file   Social History   Tobacco Use  . Smoking status: Never Smoker  . Smokeless tobacco: Never Used  Substance Use Topics  . Alcohol use: No     Current Meds  Medication Sig  . albuterol (VENTOLIN HFA) 108 (90 Base) MCG/ACT inhaler Inhale 1-2 puffs into the lungs every 6 (six) hours as needed for wheezing or shortness of breath (cough).  Marland Kitchen aspirin 81 MG chewable tablet Chew 81 mg by mouth daily.  . cholecalciferol (VITAMIN D3) 25 MCG (1000 UT) tablet Take 5,000 Units by mouth daily.  Marland Kitchen ibuprofen (ADVIL,MOTRIN) 400 MG tablet Take 1 tablet (400 mg total) by mouth every 6 (six) hours as needed.  . labetalol (NORMODYNE) 100 MG tablet Take 1 tablet (100 mg total) by mouth daily.  . metFORMIN (GLUCOPHAGE) 1000 MG tablet Take 1 tablet (1,000 mg total) by mouth 2 (two) times daily with a meal.  . omega-3 acid ethyl esters (LOVAZA) 1 g capsule Take 1,000 mg by mouth daily.  . [DISCONTINUED] glipiZIDE (GLUCOTROL XL) 2.5 MG 24 hr tablet Take 2.5 mg by mouth  daily with breakfast.  . [DISCONTINUED] labetalol (NORMODYNE) 100 MG tablet Take 1 tablet (100 mg total) by mouth once for 1 dose.  . [DISCONTINUED] labetalol (NORMODYNE) 100 MG tablet Take 100 mg by mouth daily.    ROS:  Review of Systems  Constitutional: Negative for fever.  Eyes: Negative for blurred vision.  Respiratory: Negative for cough, shortness of breath and wheezing.   Cardiovascular: Negative for chest pain and palpitations.  Genitourinary: Positive for frequency. Negative for dysuria, flank pain and hematuria.  Neurological: Negative for dizziness and headaches.  Endo/Heme/Allergies: Positive for polydipsia.     Objective:   Today's Vitals: BP 110/75 (BP Location: Left Arm, Patient Position: Sitting, Cuff Size: Normal)   Pulse 94   Temp 97.9 F (36.6 C) (Temporal)   Ht 5' 4"  (1.626 m)   Wt 205 lb 12.8 oz  (93.4 kg)   SpO2 97%   BMI 35.33 kg/m  Vitals with BMI 09/26/2019 07/11/2019 06/05/2019  Height 5' 4"  5' 4"  5' 4"   Weight 205 lbs 13 oz 212 lbs 208 lbs 10 oz  BMI 35.31 60.15 61.53  Systolic 794 327 614  Diastolic 75 88 88  Pulse 94 117 86     Physical Exam Vitals reviewed.  Constitutional:      General: She is not in acute distress.    Appearance: Normal appearance.  HENT:     Head: Normocephalic and atraumatic.  Neck:     Vascular: No carotid bruit.  Cardiovascular:     Rate and Rhythm: Normal rate and regular rhythm.     Pulses: Normal pulses.          Dorsalis pedis pulses are 2+ on the right side and 2+ on the left side.     Heart sounds: Normal heart sounds.  Pulmonary:     Effort: Pulmonary effort is normal.     Breath sounds: Normal breath sounds.  Musculoskeletal:     Right foot: No deformity.     Left foot: No deformity.  Feet:     Right foot:     Protective Sensation: 10 sites tested. 10 sites sensed.     Skin integrity: Skin integrity normal.     Toenail Condition: Right toenails are normal.     Left foot:     Protective Sensation: 10 sites tested. 10 sites sensed.     Skin integrity: Skin integrity normal.     Toenail Condition: Left toenails are normal.  Skin:    General: Skin is warm and dry.  Neurological:     General: No focal deficit present.     Mental Status: She is alert and oriented to person, place, and time.  Psychiatric:        Mood and Affect: Mood normal.        Behavior: Behavior normal.        Judgment: Judgment normal.          Assessment   1. Type 2 diabetes mellitus with hyperglycemia, without long-term current use of insulin ([place])   2. Hyperlipidemia, unspecified hyperlipidemia type   3. Hypertension, unspecified type   4. Frequency of urination       Tests ordered Orders Placed This Encounter  Procedures  . Hemoglobin A1c  . CMP with eGFR([facility])  . Vitamin D, 25-hydroxy  . Lipid Panel  .  Microalbumin/Creatinine Ratio, Urine  . Urinalysis with Culture Reflex     Plan: Please see assessment and plan per problem list below.   Meds ordered  this encounter  Medications  . glipiZIDE (GLUCOTROL) 5 MG tablet    Sig: Take 1 tablet (5 mg total) by mouth 2 (two) times daily before a meal.    Dispense:  60 tablet    Refill:  3    Order Specific Question:   Supervising Provider    Answer:   Hurshel Party C [[id number]]  . labetalol (NORMODYNE) 100 MG tablet    Sig: Take 1 tablet (100 mg total) by mouth daily.    Dispense:  90 tablet    Refill:  0    Order Specific Question:   Supervising Provider    Answer:   Doree Albee [[id number]]    Patient to follow-up in 1 month with Dr. Anastasio Champion.  At that time if her blood sugars are still elevated medications will need to be further adjusted.  Now that she is on an IUD for contraception could consider other classes such as SGLT2 or GLP-1 if necessary.  Ailene Ards, NP

## 2019-09-26 NOTE — Patient Instructions (Addendum)
Thank you for choosing [facility] as your medical provider! If you have any questions or concerns regarding your health care, please do not hesitate to call our office.  Continue your medications as prescribed.  We increased your dose of glipizide.  You will now be taking glipizide 5 mg by mouth in the morning with your first meal and in the evening with your last meal.  Remember to check your blood sugar at least twice a day and keep a log of this.  Try to check it first thing in the morning before breakfast and right before dinner.  If you notice that your blood sugars are consistently greater than 300 prior to your next appointment please call this office so we can make adjustments to your medication prior to coming in.  Please also bring all of your medications to your next office visit.  If you change your birth control from the IUD you to another method please let us know.  Please follow-up as scheduled in 1 month. We look forward to seeing you again soon!   At [facility] we value your feedback. You may receive a survey about your visit today. Please share your experience as we strive to create trusting relationships with our patients to provide genuine, compassionate, quality care.  We appreciate your understanding and patience as we review any laboratory studies, imaging, and other diagnostic tests that are ordered as we care for you. We do our best to address any and all results in a timely manner. If you do not hear about test results within 1 week, please do not hesitate to contact us. If we referred you to a specialist during your visit or ordered imaging testing, contact the office if you have not been contacted to be scheduled within 1 weeks.  We also encourage the use of MyChart, which contains your medical information for your review as well. If you are not enrolled in this feature, an access code is on this after visit summary for your convenience. Thank you for  allowing [place] to be involved in your care.  [facility] Dietary Recommendations for Weight Loss What to Avoid . Avoid added sugars o Often added sugar can be found in processed foods such as many condiments, dry cereals, cakes, cookies, chips, crisps, crackers, candies, sweetened drinks, etc.  o Read labels and AVOID/DECREASE use of foods with the following in their ingredient list: Sugar, fructose, high fructose corn syrup, sucrose, glucose, maltose, dextrose, molasses, cane sugar, brown sugar, any type of syrup, agave nectar, etc.   . Avoid snacking in between meals . Avoid foods made with flour o If you are going to eat food made with flour, choose those made with whole-grains; and, minimize your consumption as much as is tolerable . Avoid processed foods o These foods are generally stocked in the middle of the grocery store. Focus on shopping on the perimeter of the grocery.  . Avoid Meat  o We recommend following a plant-based diet at [facility]. Thus, we recommend avoiding meat as a general rule. Consider eating beans, legumes, eggs, and/or dairy products for regular protein sources o If you plan on eating meat limit to 4 ounces of meat at a time and choose lean options such as Fish, chicken, [place]. Avoid red meat intake such as pork and/or steak What to Include . Vegetables o GREEN LEAFY VEGETABLES: Kale, spinach, mustard greens, collard greens, cabbage, broccoli, etc. o OTHER: Asparagus, cauliflower, eggplant, carrots, peas,  Brussel sprouts, tomatoes, bell peppers, zucchini, beets, cucumbers, etc. . Grains, seeds, and legumes o Beans: kidney beans, black eyed peas, garbanzo beans, black beans, pinto beans, etc. o Whole, unrefined grains: brown rice, barley, bulgur, oatmeal, etc. . Healthy fats  o Avoid highly processed fats such as vegetable oil o Examples of healthy fats: avocado, olives, virgin olive oil, dark chocolate (?72% Cocoa), nuts (peanuts, almonds,  walnuts, cashews, pecans, etc.) . None to Low Intake of Animal Sources of Protein o Meat sources: chicken, [place], salmon, tuna. Limit to 4 ounces of meat at one time. o Consider limiting dairy sources, but when choosing dairy focus on: PLAIN [place] yogurt, cottage cheese, high-protein milk . Fruit o Choose berries  When to Eat . Intermittent Fasting: o Choosing not to eat for a specific time period, but DO FOCUS ON HYDRATION when fasting o Multiple Techniques: - Time Restricted Eating: eat 3 meals in a day, each meal lasting no more than 60 minutes, no snacks between meals - 16-18 hour fast: fast for 16 to 18 hours up to 7 days a week. Often suggested to start with 2-3 nonconsecutive days per week.  . Remember the time you sleep is counted as fasting.  . Examples of eating schedule: Fast from 7:00pm-11:00am. Eat between 11:00am-7:00pm.  - 24-hour fast: fast for 24 hours up to every other day. Often suggested to start with 1 day per week . Remember the time you sleep is counted as fasting . Examples of eating schedule:  o Eating day: eat 2-3 meals on your eating day. If doing 2 meals, each meal should last no more than 90 minutes. If doing 3 meals, each meal should last no more than 60 minutes. Finish last meal by 7:00pm. o Fasting day: Fast until 7:00pm.  o IF YOU FEEL UNWELL FOR ANY REASON/IN ANY WAY WHEN FASTING, STOP FASTING BY EATING A NUTRITIOUS SNACK OR LIGHT MEAL o ALWAYS FOCUS ON HYDRATION DURING FASTS - Acceptable Hydration sources: water, broths, tea/coffee (black tea/coffee is best but using a small amount of whole-fat dairy products in coffee/tea is acceptable).  - Poor Hydration Sources: anything with sugar or artificial sweeteners added to it  These recommendations have been developed for patients that are actively receiving medical care from either Dr. Karilyn Cota or Jiles Prows, DNP, NP-C at [facility]. These recommendations are developed for patients with specific  medical conditions and are not meant to be distributed or used by others that are not actively receiving care from either provider listed above at [facility]. It is not appropriate to participate in the above eating plans without proper medical supervision.   Reference: Lawrence Santiago. The obesity code. [place]; 2016.

## 2019-09-26 NOTE — Assessment & Plan Note (Signed)
We will collect serum vitamin D level today.  She will continue on her supplement as prescribed currently.

## 2019-09-26 NOTE — Assessment & Plan Note (Signed)
Blood pressure at goal today.  Patient will continue on current medication regimen at this time.

## 2019-09-26 NOTE — Assessment & Plan Note (Addendum)
We will collect A1c, metabolic panel, and check urine for albuminuria today for further evaluation.  We will also check urine for signs of urinary tract infection however I think that her urinary symptoms are most likely related to elevated blood sugar.  I did tell her to check her blood sugar twice a day (first thing in the morning before breakfast, and before dinnertime), and to keep a log of this and bring it to her next office visit.  I did tell her that if her blood sugar is consistently above 300 she should call our office prior to her next appointment.  In addition, I told her if her blood sugar is greater than 300 and she is experiencing nausea, vomiting, diarrhea, and or urinary frequency she should be evaluated in the emergency department.  She tells me she understands.  I have increased her glipizide from 2-1/2 mg daily to 5 mg twice a day.  I have also given her a handout with dietary recommendations to help her further control her blood sugars.

## 2019-09-27 ENCOUNTER — Other Ambulatory Visit (INDEPENDENT_AMBULATORY_CARE_PROVIDER_SITE_OTHER): Payer: Self-pay | Admitting: Nurse Practitioner

## 2019-09-27 DIAGNOSIS — E1165 Type 2 diabetes mellitus with hyperglycemia: Secondary | ICD-10-CM

## 2019-09-27 DIAGNOSIS — E782 Mixed hyperlipidemia: Secondary | ICD-10-CM

## 2019-09-27 DIAGNOSIS — R319 Hematuria, unspecified: Secondary | ICD-10-CM

## 2019-09-27 LAB — MICROALBUMIN / CREATININE URINE RATIO
Creatinine, Urine: 154 mg/dL (ref 20–275)
Microalb Creat Ratio: 315 mcg/mg creat — ABNORMAL HIGH (ref <30)
Microalb, Ur: 48.5 mg/dL

## 2019-09-27 LAB — LIPID PANEL
Cholesterol: 164 mg/dL (ref <200)
HDL: 27 mg/dL — ABNORMAL LOW (ref >=50)
Non-HDL Cholesterol (Calc): 137 mg/dL (calc) — ABNORMAL HIGH (ref <130)
Total CHOL/HDL Ratio: 6.1 (calc) — ABNORMAL HIGH (ref <5.0)
Triglycerides: 822 mg/dL — ABNORMAL HIGH (ref <150)

## 2019-09-27 LAB — COMPLETE METABOLIC PANEL WITH GFR
AG Ratio: 1.5 (calc) (ref 1.0–2.5)
ALT: 82 U/L — ABNORMAL HIGH (ref 6–29)
AST: 61 U/L — ABNORMAL HIGH (ref 10–30)
Albumin: 4.4 g/dL (ref 3.6–5.1)
Alkaline phosphatase (APISO): 66 U/L (ref 31–125)
BUN: 11 mg/dL (ref 7–25)
CO2: 23 mmol/L (ref 20–32)
Calcium: 9.7 mg/dL (ref 8.6–10.2)
Chloride: 100 mmol/L (ref 98–110)
Creat: 0.69 mg/dL (ref 0.50–1.10)
GFR, Est African American: 142 mL/min/{1.73_m2} (ref >=60)
GFR, Est Non African American: 123 mL/min/{1.73_m2} (ref >=60)
Globulin: 3 g/dL (calc) (ref 1.9–3.7)
Glucose, Bld: 327 mg/dL — ABNORMAL HIGH (ref 65–99)
Potassium: 4.1 mmol/L (ref 3.5–5.3)
Sodium: 137 mmol/L (ref 135–146)
Total Bilirubin: 0.4 mg/dL (ref 0.2–1.2)
Total Protein: 7.4 g/dL (ref 6.1–8.1)

## 2019-09-27 LAB — URINALYSIS W MICROSCOPIC + REFLEX CULTURE
Bacteria, UA: NONE SEEN /HPF
Bilirubin Urine: NEGATIVE
Hyaline Cast: NONE SEEN /LPF
Leukocyte Esterase: NEGATIVE
Nitrites, Initial: NEGATIVE
Specific Gravity, Urine: >=1.045 — AB (ref 1.001–1.03)
pH: 5.5 (ref 5.0–8.0)

## 2019-09-27 LAB — VITAMIN D 25 HYDROXY (VIT D DEFICIENCY, FRACTURES): Vit D, 25-Hydroxy: 16 ng/mL — ABNORMAL LOW (ref 30–100)

## 2019-09-27 LAB — HEMOGLOBIN A1C
Hgb A1c MFr Bld: 11 % of total Hgb — ABNORMAL HIGH (ref <5.7)
Mean Plasma Glucose: 269 (calc)
eAG (mmol/L): 14.9 (calc)

## 2019-09-27 LAB — NO CULTURE INDICATED

## 2019-09-27 NOTE — Progress Notes (Signed)
Orders related to most recent lab results

## 2019-10-17 ENCOUNTER — Encounter: Payer: Self-pay | Admitting: Nutrition

## 2019-10-17 ENCOUNTER — Other Ambulatory Visit: Payer: Self-pay

## 2019-10-17 ENCOUNTER — Encounter: Payer: Self-pay | Admitting: "Endocrinology

## 2019-10-17 ENCOUNTER — Encounter: Payer: Medicaid Other | Attending: Internal Medicine | Admitting: Nutrition

## 2019-10-17 ENCOUNTER — Ambulatory Visit (INDEPENDENT_AMBULATORY_CARE_PROVIDER_SITE_OTHER): Payer: Medicaid Other | Admitting: "Endocrinology

## 2019-10-17 VITALS — BP 123/85 | HR 83 | Ht 64.0 in | Wt 205.4 lb

## 2019-10-17 DIAGNOSIS — E1165 Type 2 diabetes mellitus with hyperglycemia: Secondary | ICD-10-CM | POA: Diagnosis not present

## 2019-10-17 DIAGNOSIS — I10 Essential (primary) hypertension: Secondary | ICD-10-CM | POA: Diagnosis present

## 2019-10-17 DIAGNOSIS — E669 Obesity, unspecified: Secondary | ICD-10-CM | POA: Insufficient documentation

## 2019-10-17 DIAGNOSIS — E782 Mixed hyperlipidemia: Secondary | ICD-10-CM

## 2019-10-17 NOTE — Progress Notes (Signed)
Endocrinology Consult Note       10/17/2019, 11:49 AM   Subjective:    Patient ID: Monica Jennings, female    DOB: [birth year].  Monica Jennings is being seen in consultation for management of currently uncontrolled symptomatic diabetes requested by  Doree Albee, MD.   Past Medical History:  Diagnosis Date  . ADHD (attention deficit hyperactivity disorder)   . Diabetes ([place])   . Hyperlipidemia   . Hypertension   . Migraines   . Vitamin D deficiency     Past Surgical History:  Procedure Laterality Date  . NO PAST SURGERIES      Social History   Socioeconomic History  . Marital status: Single    Spouse name: Not on file  . Number of children: 1  . Years of education: Not on file  . Highest education level: Not on file  Occupational History  . Occupation: Daycare  Tobacco Use  . Smoking status: Never Smoker  . Smokeless tobacco: Never Used  Substance and Sexual Activity  . Alcohol use: No  . Drug use: No  . Sexual activity: Yes    Birth control/protection: None  Other Topics Concern  . Not on file  Social History Narrative  . Not on file   Social Determinants of Health   Financial Resource Strain:   . Difficulty of Paying Living Expenses: Not on file  Food Insecurity:   . Worried About Charity fundraiser in the Last Year: Not on file  . Ran Out of Food in the Last Year: Not on file  Transportation Needs:   . Lack of Transportation (Medical): Not on file  . Lack of Transportation (Non-Medical): Not on file  Physical Activity:   . Days of Exercise per Week: Not on file  . Minutes of Exercise per Session: Not on file  Stress:   . Feeling of Stress : Not on file  Social Connections:   . Frequency of Communication with Friends and Family: Not on file  . Frequency of Social Gatherings with Friends and Family: Not on file  . Attends Religious Services: Not on file  . Active  Member of Clubs or Organizations: Not on file  . Attends Archivist Meetings: Not on file  . Marital Status: Not on file    Family History  Problem Relation Age of Onset  . ADD / ADHD Brother   . Diabetes Maternal Grandfather   . Hypertension Maternal Grandfather     Outpatient Encounter Medications as of 10/17/2019  Medication Sig  . folic acid (FOLVITE) 440 MCG tablet Take 400 mcg by mouth daily.  Marland Kitchen albuterol (VENTOLIN HFA) 108 (90 Base) MCG/ACT inhaler Inhale 1-2 puffs into the lungs every 6 (six) hours as needed for wheezing or shortness of breath (cough).  . cholecalciferol (VITAMIN D3) 25 MCG (1000 UT) tablet Take 5,000 Units by mouth daily.  Marland Kitchen glipiZIDE (GLUCOTROL) 5 MG tablet Take 1 tablet (5 mg total) by mouth 2 (two) times daily before a meal.  . ibuprofen (ADVIL,MOTRIN) 400 MG tablet Take 1 tablet (400 mg total) by mouth every 6 (six) hours  as needed.  . labetalol (NORMODYNE) 100 MG tablet Take 1 tablet (100 mg total) by mouth daily.  . metFORMIN (GLUCOPHAGE) 1000 MG tablet Take 1 tablet (1,000 mg total) by mouth 2 (two) times daily with a meal.  . omega-3 acid ethyl esters (LOVAZA) 1 g capsule Take 1,000 mg by mouth daily.  . [DISCONTINUED] aspirin 81 MG chewable tablet Chew 81 mg by mouth daily.   No facility-administered encounter medications on file as of 10/17/2019.    ALLERGIES: Allergies  Allergen Reactions  . Augmentin [Amoxicillin-Pot Clavulanate]     Nausea and vomiting  . Sulfamethoxazole Other (See Comments)  . Penicillins Nausea And Vomiting and Other (See Comments)    Unknown Nausea and vomiting Unknown     VACCINATION STATUS: Immunization History  Administered Date(s) Administered  . Influenza,inj,Quad PF,6+ Mos 06/05/2019    Diabetes She presents for her initial diabetic visit. She has type 2 diabetes mellitus. Her disease course has been worsening. There are no hypoglycemic associated symptoms. Pertinent negatives for hypoglycemia  include no confusion, headaches, pallor or seizures. Associated symptoms include blurred vision, fatigue, polydipsia and polyuria. Pertinent negatives for diabetes include no chest pain and no polyphagia. There are no hypoglycemic complications. Symptoms are worsening. There are no diabetic complications. Risk factors for coronary artery disease include diabetes mellitus, dyslipidemia, family history, hypertension, obesity and sedentary lifestyle. Current diabetic treatments: She is currently on meds for meet 1000 mg p.o. twice daily, glipizide 5 mg p.o. twice daily. Her weight is increasing steadily. She is following a generally unhealthy diet. When asked about meal planning, she reported none. She rarely participates in exercise. Her home blood glucose trend is fluctuating minimally. Her overall blood glucose range is >200 mg/dl. (She brought a log book with her showing average blood glucose greater than 200 mg per DL.  Her most recent labs show A1c of 11% on September 26, 2019.) An ACE inhibitor/angiotensin II receptor blocker is not being taken. Eye exam is not current.  Hyperlipidemia This is a chronic problem. The current episode started more than 1 year ago. The problem is uncontrolled. Exacerbating diseases include diabetes and obesity. Pertinent negatives include no chest pain, myalgias or shortness of breath. Current antihyperlipidemic treatment includes bile acid squestrants. Risk factors for coronary artery disease include family history, dyslipidemia, diabetes mellitus, obesity, hypertension and a sedentary lifestyle.  Hypertension This is a chronic problem. The current episode started more than 1 year ago. Associated symptoms include blurred vision. Pertinent negatives include no chest pain, headaches, palpitations or shortness of breath. Risk factors for coronary artery disease include dyslipidemia, diabetes mellitus, family history, obesity and sedentary lifestyle. Past treatments include beta  blockers.     Review of Systems  Constitutional: Positive for fatigue. Negative for chills, fever and unexpected weight change.  HENT: Negative for trouble swallowing and voice change.   Eyes: Positive for blurred vision. Negative for visual disturbance.  Respiratory: Negative for cough, shortness of breath and wheezing.   Cardiovascular: Negative for chest pain, palpitations and leg swelling.  Gastrointestinal: Negative for diarrhea, nausea and vomiting.  Endocrine: Positive for polydipsia and polyuria. Negative for cold intolerance, heat intolerance and polyphagia.  Musculoskeletal: Negative for arthralgias and myalgias.  Skin: Negative for color change, pallor, rash and wound.  Neurological: Negative for seizures and headaches.  Psychiatric/Behavioral: Negative for confusion and suicidal ideas.    Objective:    Vitals with BMI 10/17/2019 09/26/2019 07/11/2019  Height 5\' 4"  5\' 4"  5\' 4"   Weight 205 lbs 6  oz 205 lbs 13 oz 212 lbs  BMI 35.24 35.31 36.37  Systolic 123 110 725  Diastolic 85 75 88  Pulse 83 94 117    BP 123/85   Pulse 83   Ht 5\' 4"  (1.626 m)   Wt 205 lb 6.4 oz (93.2 kg)   BMI 35.26 kg/m   Wt Readings from Last 3 Encounters:  10/17/19 205 lb 6.4 oz (93.2 kg)  09/26/19 205 lb 12.8 oz (93.4 kg)  07/11/19 212 lb (96.2 kg)     Physical Exam Constitutional:      Appearance: She is well-developed.  HENT:     Head: Normocephalic and atraumatic.  Neck:     Thyroid: No thyromegaly.     Trachea: No tracheal deviation.  Cardiovascular:     Rate and Rhythm: Normal rate and regular rhythm.  Pulmonary:     Effort: Pulmonary effort is normal.  Abdominal:     General: Abdomen is flat.     Palpations: Abdomen is soft.     Tenderness: There is no abdominal tenderness. There is no guarding.  Musculoskeletal:        General: Normal range of motion.     Cervical back: Normal range of motion and neck supple.  Skin:    General: Skin is warm and dry.     Coloration: Skin  is not pale.     Findings: No erythema or rash.     Comments: Acanthosis nigricans on her neck.  Neurological:     Mental Status: She is alert and oriented to person, place, and time.     Cranial Nerves: No cranial nerve deficit.     Coordination: Coordination normal.     Deep Tendon Reflexes: Reflexes are normal and symmetric.  Psychiatric:        Judgment: Judgment normal.       CMP ( most recent) CMP     Component Value Date/Time   NA 137 09/26/2019 0915   K 4.1 09/26/2019 0915   CL 100 09/26/2019 0915   CO2 23 09/26/2019 0915   GLUCOSE 327 (H) 09/26/2019 0915   BUN 11 09/26/2019 0915   CREATININE 0.69 09/26/2019 0915   CALCIUM 9.7 09/26/2019 0915   PROT 7.4 09/26/2019 0915   ALBUMIN 3.6 02/04/2010 0906   AST 61 (H) 09/26/2019 0915   ALT 82 (H) 09/26/2019 0915   ALKPHOS 105 02/04/2010 0906   BILITOT 0.4 09/26/2019 0915   GFRNONAA 123 09/26/2019 0915   GFRAA 142 09/26/2019 0915     Diabetic Labs (most recent): Lab Results  Component Value Date   HGBA1C 11.0 (H) 09/26/2019   HGBA1C 7.4 (H) 06/05/2019     Lipid Panel ( most recent) Lipid Panel     Component Value Date/Time   CHOL 164 09/26/2019 0915   TRIG 822 (H) 09/26/2019 0915   HDL 27 (L) 09/26/2019 0915   CHOLHDL 6.1 (H) 09/26/2019 0915   LDLCALC  09/26/2019 0915     Comment:     . LDL cholesterol not calculated. Triglyceride levels greater than 400 mg/dL invalidate calculated LDL results. . Reference range: <100 . Desirable range <100 mg/dL for primary prevention;   <70 mg/dL for patients with CHD or diabetic patients  with > or = 2 CHD risk factors. 09/28/2019 LDL-C is now calculated using the Martin-Hopkins  calculation, which is a validated novel method providing  better accuracy than the Friedewald equation in the  estimation of LDL-C.  Marland Kitchen et al. Horald Pollen. Lenox Ahr):  6073-7106  (http://education.[employer].com/faq/FAQ164)       Lab Results  Component Value Date   TSH 2.68  06/05/2019      Assessment & Plan:   1. Uncontrolled type 2 diabetes mellitus with hyperglycemia (HCC)  - Monica Jennings has currently uncontrolled symptomatic type 2 DM since  24 years of age,  with most recent A1c of 11 %. Recent labs reviewed. - I had a long discussion with her about the progressive nature of diabetes and the pathology behind its complications. She brought a log book with her showing average blood glucose greater than 200 mg per DL.  Her most recent labs show A1c of 11% on September 26, 2019  -her diabetes is complicated by obesity/sedentary life and she remains at a high risk for more acute and chronic complications which include CAD, CVA, CKD, retinopathy, and neuropathy. These are all discussed in detail with her.  - I have counseled her on diet  and weight management  by adopting a carbohydrate restricted/protein rich diet. Patient is encouraged to switch to  unprocessed or minimally processed     complex starch and increased protein intake (animal or plant source), fruits, and vegetables. -  she is advised to stick to a routine mealtimes to eat 3 meals  a day and avoid unnecessary snacks ( to snack only to correct hypoglycemia).   - she admits that there is a room for improvement in her food and drink choices. - Suggestion is made for her to avoid simple carbohydrates  from her diet including Cakes, Sweet Desserts, Ice Cream, Soda (diet and regular), Sweet Tea, Candies, Chips, Cookies, Store Bought Juices, Alcohol in Excess of  1-2 drinks a day, Artificial Sweeteners,  Coffee Creamer, and "Sugar-free" Products. This will help patient to have more stable blood glucose profile and potentially avoid unintended weight gain.  - she will be scheduled with Norm Salt, RDN, CDE for diabetes education.  - I have approached her with the following individualized plan to manage  her diabetes and patient agrees:   - she will likely need insulin treatment in order for her to  achieve control of diabetes to target. -In preparation, she is advised to start monitoring blood glucose strictly 4 times a day-before meals and at bedtime and return in 10 days with her meter and logs for reevaluation.   -She will also see the certified diabetes educator today.   - she is encouraged to call clinic for blood glucose levels less than 70 or above 300 mg /dl. - she is advised to continue Metformin 1000 mg p.o. twice daily as well as glipizide 5 mg p.o. twice daily, therapeutically suitable for patient .  - she will be considered for incretin therapy as appropriate next visit.  - Specific targets for  A1c;  LDL, HDL,  and Triglycerides were discussed with the patient.  2) Blood Pressure /Hypertension:  her blood pressure is  controlled to target.   she is advised to continue her current medications including labetalol 100 mg p.o. daily with breakfast . 3) Lipids/Hyperlipidemia:   Review of her recent lipid panel showed un controlled hypertriglyceridemia.  She is not on statins, advised to continue Lovaza 1 g p.o. twice daily.   She will be considered for low-dose Crestor on subsequent visits.  4) vitamin D deficiency: She is on ongoing vitamin D supplement, currently on vitamin D3 5000 units daily.  She is advised to continue.  5)  Weight/Diet:  Body mass index is 35.26  kg/m.  -   clearly complicating her diabetes care.   she is  a candidate for weight loss. I discussed with her the fact that loss of 5 - 10% of her  current body weight will have the most impact on her diabetes management.  Exercise, and detailed carbohydrates information provided  -  detailed on discharge instructions.  5) Chronic Care/Health Maintenance:  -she  Is not on ACEI/ARB and Statin medications,   is encouraged to initiate and continue to follow up with Ophthalmology, Dentist,  Podiatrist at least yearly or according to recommendations, and advised to   stay away from smoking. I have recommended yearly flu  vaccine and pneumonia vaccine at least every 5 years; moderate intensity exercise for up to 150 minutes weekly; and  sleep for at least 7 hours a day.  - she is  advised to maintain close follow up with Wilson SingerGosrani, Nimish C, MD for primary care needs, as well as her other providers for optimal and coordinated care.   - Time spent in this patient care: 60 min, of which > 50% was spent in  counseling  her about her currently uncontrolled type 2 diabetes, hyperlipidemia, hypertension and the rest reviewing her blood glucose logs , discussing her hypoglycemia and hyperglycemia episodes, reviewing her current and  previous labs / studies  ( including abstraction from other facilities) and medications  doses and developing a  long term treatment plan based on the latest standards of care/ guidelines; and documenting her care.    Please refer to Patient Instructions for Blood Glucose Monitoring and Insulin/Medications Dosing Guide"  in media tab for additional information. Please  also refer to " Patient Self Inventory" in the Media  tab for reviewed elements of pertinent patient history.  Monica Jennings participated in the discussions, expressed understanding, and voiced agreement with the above plans.  All questions were answered to her satisfaction. she is encouraged to contact clinic should she have any questions or concerns prior to her return visit.   Follow up plan: - Return in about 10 days (around 10/27/2019), or office, for Follow up with Meter and Logs Only - no Labs.  Marquis LunchGebre Saharsh Sterling, MD [facility] [address] [place], [place] [address] Phone: [phone]  Fax: [phone]    10/17/2019, 11:49 AM  This note was partially dictated with voice recognition software. Similar sounding words can be transcribed inadequately or may not  be corrected upon review.

## 2019-10-17 NOTE — Patient Instructions (Signed)

## 2019-10-17 NOTE — Patient Instructions (Signed)
Goals Follow My Plate Eat 3 balanced meals at times discussed Don'[t skip meals Cut out snacks unless veggies. Drink only water Walk 60 minutes 3-4 times per week. Get A1C down to 7%.

## 2019-10-17 NOTE — Progress Notes (Signed)
  Medical Nutrition Therapy:  Appt start time: 1130 end time:  1200.   Assessment:  Primary concerns today: Type 2 DM, Obesity. Here with her Gma. She is post partum 4 months. Here to see Dr. Fransico Him, Endocrinology. Walk in visit. Will do safety questions next visit.  She admits to being a snacker and not eating well balanced meals. Willing to change eating habits and exercise to help with weight loss and improve DM. Marland Kitchen Says she craves sweets and salty foods.  Lab Results  Component Value Date   HGBA1C 11.0 (H) 09/26/2019    Preferred Learning Style:   Auditory  Visual  Hands on  No preference indicated   Learning Readiness:   Ready  Change in progress   MEDICATIONS:    DIETARY INTAKE:   Doesn't eat full meals. Grazes during the day.   Usual physical activity: Walks some  Estimated energy needs: 1200 calories 135 g carbohydrates 90 g protein 44 g fat  Progress Towards Goal(s):  In progress.   Nutritional Diagnosis:  NB-1.1 Food and nutrition-related knowledge deficit As related to DIabetes Type 2 .  As evidenced by A1C 11%.     Intervention:  Nutrition and Diabetes education provided on My Plate, CHO counting, meal planning, portion sizes, timing of meals, avoiding snacks between meals unless having a low blood sugar, target ranges for A1C and blood sugars, signs/symptoms and treatment of hyper/hypoglycemia, monitoring blood sugars, taking medications as prescribed, benefits of exercising 30 minutes per day and prevention of complications of DM.   Goals Follow My Plate Eat 3 balanced meals at times discussed Don'[t skip meals Cut out snacks unless veggies. Drink only water Walk 60 minutes 3-4 times per week. Get A1C down to 7%.  Teaching Method Utilized:  Visual Auditory Hands on  Handouts given during visit include:  The Plate Method   Diabetes Instructions   Barriers to learning/adherence to lifestyle change: none  Demonstrated degree of  understanding via:  Teach Back   Monitoring/Evaluation:  Dietary intake, exercise, , and body weight in 1 month(s).

## 2019-10-30 ENCOUNTER — Ambulatory Visit (INDEPENDENT_AMBULATORY_CARE_PROVIDER_SITE_OTHER): Payer: Medicaid Other | Admitting: "Endocrinology

## 2019-10-30 ENCOUNTER — Encounter: Payer: Medicaid Other | Attending: "Endocrinology | Admitting: Nutrition

## 2019-10-30 ENCOUNTER — Encounter: Payer: Self-pay | Admitting: "Endocrinology

## 2019-10-30 ENCOUNTER — Other Ambulatory Visit: Payer: Self-pay

## 2019-10-30 ENCOUNTER — Encounter: Payer: Self-pay | Admitting: Nutrition

## 2019-10-30 VITALS — BP 113/78 | HR 94 | Ht 64.0 in | Wt 206.8 lb

## 2019-10-30 DIAGNOSIS — E669 Obesity, unspecified: Secondary | ICD-10-CM

## 2019-10-30 DIAGNOSIS — I10 Essential (primary) hypertension: Secondary | ICD-10-CM

## 2019-10-30 DIAGNOSIS — E1165 Type 2 diabetes mellitus with hyperglycemia: Secondary | ICD-10-CM | POA: Insufficient documentation

## 2019-10-30 DIAGNOSIS — E782 Mixed hyperlipidemia: Secondary | ICD-10-CM | POA: Diagnosis not present

## 2019-10-30 NOTE — Patient Instructions (Signed)
  Goals Follow My Plate Eat 3 balanced meals at times discussed Don'[t skip meals Cut out snacks unless veggies. Drink only water Walk 60 minutes 3-4 times per week. Get A1C down to 7%.

## 2019-10-30 NOTE — Progress Notes (Signed)
10/30/2019, 5:58 PM  Endocrinology follow-up note   Subjective:    Patient ID: Monica SavinSantashia C Jennings, female    DOB: [birth year].  Monica Jennings is being seen in follow-up after she was seen in consultation for management of currently uncontrolled symptomatic diabetes requested by  Wilson SingerGosrani, Nimish C, MD.   Past Medical History:  Diagnosis Date  . ADHD (attention deficit hyperactivity disorder)   . Diabetes (HCC)   . Hyperlipidemia   . Hypertension   . Migraines   . Vitamin D deficiency     Past Surgical History:  Procedure Laterality Date  . NO PAST SURGERIES      Social History   Socioeconomic History  . Marital status: Single    Spouse name: Not on file  . Number of children: 1  . Years of education: Not on file  . Highest education level: Not on file  Occupational History  . Occupation: Daycare  Tobacco Use  . Smoking status: Never Smoker  . Smokeless tobacco: Never Used  Substance and Sexual Activity  . Alcohol use: No  . Drug use: No  . Sexual activity: Yes    Birth control/protection: None  Other Topics Concern  . Not on file  Social History Narrative  . Not on file   Social Determinants of Health   Financial Resource Strain:   . Difficulty of Paying Living Expenses:   Food Insecurity:   . Worried About Programme researcher, broadcasting/film/videounning Out of Food in the Last Year:   . Baristaan Out of Food in the Last Year:   Transportation Needs:   . Freight forwarderLack of Transportation (Medical):   Marland Kitchen. Lack of Transportation (Non-Medical):   Physical Activity:   . Days of Exercise per Week:   . Minutes of Exercise per Session:   Stress:   . Feeling of Stress :   Social Connections:   . Frequency of Communication with Friends and Family:   . Frequency of Social Gatherings with Friends and Family:   . Attends Religious Services:   . Active Member of Clubs or Organizations:   . Attends BankerClub or Organization Meetings:   Marland Kitchen. Marital Status:     Family History  Problem Relation Age  of Onset  . ADD / ADHD Brother   . Diabetes Maternal Grandfather   . Hypertension Maternal Grandfather     Outpatient Encounter Medications as of 10/30/2019  Medication Sig  . albuterol (VENTOLIN HFA) 108 (90 Base) MCG/ACT inhaler Inhale 1-2 puffs into the lungs every 6 (six) hours as needed for wheezing or shortness of breath (cough).  . cholecalciferol (VITAMIN D3) 25 MCG (1000 UT) tablet Take 5,000 Units by mouth daily.  . folic acid (FOLVITE) 400 MCG tablet Take 400 mcg by mouth daily.  Marland Kitchen. glipiZIDE (GLUCOTROL) 5 MG tablet Take 1 tablet (5 mg total) by mouth 2 (two) times daily before a meal.  . ibuprofen (ADVIL,MOTRIN) 400 MG tablet Take 1 tablet (400 mg total) by mouth every 6 (six) hours as needed.  . labetalol (NORMODYNE) 100 MG tablet Take 1 tablet (100 mg total) by mouth daily.  . metFORMIN (GLUCOPHAGE) 1000 MG tablet Take 1 tablet (1,000 mg total) by mouth 2 (two) times daily with a meal.  . omega-3 acid ethyl esters (LOVAZA) 1 g capsule Take 1,000 mg by mouth daily.   No facility-administered encounter medications on file as of 10/30/2019.    ALLERGIES: Allergies  Allergen Reactions  . Augmentin [Amoxicillin-Pot Clavulanate]  Nausea and vomiting  . Sulfamethoxazole Other (See Comments)  . Penicillins Nausea And Vomiting and Other (See Comments)    Unknown Nausea and vomiting Unknown     VACCINATION STATUS: Immunization History  Administered Date(s) Administered  . Influenza,inj,Quad PF,6+ Mos 06/05/2019    Diabetes She presents for her follow-up diabetic visit. She has type 2 diabetes mellitus. Her disease course has been improving. There are no hypoglycemic associated symptoms. Pertinent negatives for hypoglycemia include no confusion, headaches, pallor or seizures. Associated symptoms include fatigue. Pertinent negatives for diabetes include no blurred vision, no chest pain, no polydipsia, no polyphagia and no polyuria. There are no hypoglycemic complications.  Symptoms are improving. There are no diabetic complications. Risk factors for coronary artery disease include diabetes mellitus, dyslipidemia, family history, hypertension, obesity and sedentary lifestyle. Current diabetic treatments: She is currently on meds for meet 1000 mg p.o. twice daily, glipizide 5 mg p.o. twice daily. Her weight is increasing steadily. She is following a generally unhealthy diet. When asked about meal planning, she reported none. She rarely participates in exercise. Her home blood glucose trend is decreasing steadily. Her breakfast blood glucose range is generally 130-140 mg/dl. Her lunch blood glucose range is generally 130-140 mg/dl. Her dinner blood glucose range is generally 130-140 mg/dl. Her bedtime blood glucose range is generally 130-140 mg/dl. Her overall blood glucose range is 130-140 mg/dl. (She brought a log book with her showing average blood glucose greater than 200 mg per DL.  Her most recent labs show A1c of 11% on September 26, 2019.) An ACE inhibitor/angiotensin II receptor blocker is not being taken. Eye exam is not current.  Hyperlipidemia This is a chronic problem. The current episode started more than 1 year ago. The problem is uncontrolled. Exacerbating diseases include diabetes and obesity. Pertinent negatives include no chest pain, myalgias or shortness of breath. Current antihyperlipidemic treatment includes bile acid squestrants. Risk factors for coronary artery disease include family history, dyslipidemia, diabetes mellitus, obesity, hypertension and a sedentary lifestyle.  Hypertension This is a chronic problem. The current episode started more than 1 year ago. Pertinent negatives include no blurred vision, chest pain, headaches, palpitations or shortness of breath. Risk factors for coronary artery disease include dyslipidemia, diabetes mellitus, family history, obesity and sedentary lifestyle. Past treatments include beta blockers.     Review of Systems   Constitutional: Positive for fatigue. Negative for chills, fever and unexpected weight change.  HENT: Negative for trouble swallowing and voice change.   Eyes: Negative for blurred vision and visual disturbance.  Respiratory: Negative for cough, shortness of breath and wheezing.   Cardiovascular: Negative for chest pain, palpitations and leg swelling.  Gastrointestinal: Negative for diarrhea, nausea and vomiting.  Endocrine: Negative for cold intolerance, heat intolerance, polydipsia, polyphagia and polyuria.  Musculoskeletal: Negative for arthralgias and myalgias.  Skin: Negative for color change, pallor, rash and wound.  Neurological: Negative for seizures and headaches.  Psychiatric/Behavioral: Negative for confusion and suicidal ideas.    Objective:    Vitals with BMI 10/30/2019 10/17/2019 09/26/2019  Height 5\' 4"  5\' 4"  5\' 4"   Weight 206 lbs 13 oz 205 lbs 6 oz 205 lbs 13 oz  BMI 35.48 35.24 35.31  Systolic 113 123  Diastolic 78 85 75  Pulse 94 83 94    BP 113/78   Pulse 94   Ht 5\' 4"  (1.626 m)   Wt 206 lb 12.8 oz (93.8 kg)   BMI 35.50 kg/m   Wt Readings from Last 3 Encounters:  10/30/19 206 lb 12.8 oz (93.8 kg)  10/17/19 205 lb 6.4 oz (93.2 kg)  09/26/19 205 lb 12.8 oz (93.4 kg)     Physical Exam Constitutional:      Appearance: She is well-developed.  HENT:     Head: Normocephalic and atraumatic.  Neck:     Thyroid: No thyromegaly.     Trachea: No tracheal deviation.  Cardiovascular:     Rate and Rhythm: Normal rate and regular rhythm.  Pulmonary:     Effort: Pulmonary effort is normal.  Abdominal:     General: Abdomen is flat.     Palpations: Abdomen is soft.     Tenderness: There is no abdominal tenderness. There is no guarding.  Musculoskeletal:        General: Normal range of motion.     Cervical back: Normal range of motion and neck supple.  Skin:    General: Skin is warm and dry.     Coloration: Skin is not pale.     Findings: No erythema or  rash.     Comments: Acanthosis nigricans on her neck.  Neurological:     Mental Status: She is alert and oriented to person, place, and time.     Cranial Nerves: No cranial nerve deficit.     Coordination: Coordination normal.     Deep Tendon Reflexes: Reflexes are normal and symmetric.  Psychiatric:        Judgment: Judgment normal.     CMP     Component Value Date/Time   NA 137 09/26/2019 0915   K 4.1 09/26/2019 0915   CL 100 09/26/2019 0915   CO2 23 09/26/2019 0915   GLUCOSE 327 (H) 09/26/2019 0915   BUN 11 09/26/2019 0915   CREATININE 0.69 09/26/2019 0915   CALCIUM 9.7 09/26/2019 0915   PROT 7.4 09/26/2019 0915   ALBUMIN 3.6 02/04/2010 0906   AST 61 (H) 09/26/2019 0915   ALT 82 (H) 09/26/2019 0915   ALKPHOS 105 02/04/2010 0906   BILITOT 0.4 09/26/2019 0915   GFRNONAA 123 09/26/2019 0915   GFRAA 142 09/26/2019 0915     Diabetic Labs (most recent): Lab Results  Component Value Date   HGBA1C 11.0 (H) 09/26/2019   HGBA1C 7.4 (H) 06/05/2019     Lipid Panel ( most recent) Lipid Panel     Component Value Date/Time   CHOL 164 09/26/2019 0915   TRIG 822 (H) 09/26/2019 0915   HDL 27 (L) 09/26/2019 0915   CHOLHDL 6.1 (H) 09/26/2019 0915   LDLCALC  09/26/2019 0915     Comment:     . LDL cholesterol not calculated. Triglyceride levels greater than 400 mg/dL invalidate calculated LDL results. . Reference range: <100 . Desirable range <100 mg/dL for primary prevention;   <70 mg/dL for patients with CHD or diabetic patients  with > or = 2 CHD risk factors. Marland Kitchen LDL-C is now calculated using the Martin-Hopkins  calculation, which is a validated novel method providing  better accuracy than the Friedewald equation in the  estimation of LDL-C.  Horald Pollen et al. Lenox Ahr. 8588;502(77): 2061-2068  (http://education.[facility].com/faq/FAQ164)       Lab Results  Component Value Date   TSH 2.68 06/05/2019      Assessment & Plan:   1. Uncontrolled type 2  diabetes mellitus with hyperglycemia (HCC)  - Estelita C Avetisyan has currently uncontrolled symptomatic type 2 DM since  24 years of age,  with most recent A1c of 11 %. Recent labs reviewed.  -  She made significant change in her diet and presents with significantly improved glycemic profile near target both fasting and postprandial.   -her diabetes is complicated by obesity/sedentary life and she remains at a high risk for more acute and chronic complications which include CAD, CVA, CKD, retinopathy, and neuropathy. These are all discussed in detail with her.  - I have counseled her on diet  and weight management  by adopting a carbohydrate restricted/protein rich diet. Patient is encouraged to switch to  unprocessed or minimally processed     complex starch and increased protein intake (animal or plant source), fruits, and vegetables. -  she is advised to stick to a routine mealtimes to eat 3 meals  a day and avoid unnecessary snacks ( to snack only to correct hypoglycemia).   - she  admits there is a room for improvement in her diet and drink choices. -  Suggestion is made for her to avoid simple carbohydrates  from her diet including Cakes, Sweet Desserts / Pastries, Ice Cream, Soda (diet and regular), Sweet Tea, Candies, Chips, Cookies, Sweet Pastries,  Store Bought Juices, Alcohol in Excess of  1-2 drinks a day, Artificial Sweeteners, Coffee Creamer, and "Sugar-free" Products. This will help patient to have stable blood glucose profile and potentially avoid unintended weight gain.   - she will be scheduled with Norm Salt, RDN, CDE for diabetes education.  - I have approached her with the following individualized plan to manage  her diabetes and patient agrees:   -Based on her presentation with near target glycemic profile, she would not need insulin treatment for now. -He is willing to continue monitoring blood glucose twice a day-daily before breakfast and at bedtime.  - she is  encouraged to call clinic for blood glucose levels less than 70 or above 200 mg /dl. - she is advised to continue Metformin 1000 mg p.o. twice daily as well as glipizide 5 mg p.o. twice daily, therapeutically suitable for patient .  - she will be considered for incretin therapy as appropriate next visit.  - Specific targets for  A1c;  LDL, HDL,  and Triglycerides were discussed with the patient.  2) Blood Pressure /Hypertension: Her blood pressure is controlled to target.   she is advised to continue her current medications including labetalol 100 mg p.o. daily with breakfast .  3) Lipids/Hyperlipidemia:   Review of her recent lipid panel showed un controlled hypertriglyceridemia.  She is not on statins, advised to continue Lovaza 1 g p.o. twice daily.   She will be considered for low-dose Crestor on subsequent visits.  4) vitamin D deficiency: She is on ongoing vitamin D supplement, currently on vitamin D3 5000 units daily.  She is advised to continue.  5)  Weight/Diet:  Body mass index is 35.5 kg/m.  -   clearly complicating her diabetes care.   she is  a candidate for weight loss. I discussed with her the fact that loss of 5 - 10% of her  current body weight will have the most impact on her diabetes management.  Exercise, and detailed carbohydrates information provided  -  detailed on discharge instructions.  5) Chronic Care/Health Maintenance:  -she  Is not on ACEI/ARB and Statin medications,   is encouraged to initiate and continue to follow up with Ophthalmology, Dentist,  Podiatrist at least yearly or according to recommendations, and advised to   stay away from smoking. I have recommended yearly flu vaccine and pneumonia vaccine at least every 5 years;  moderate intensity exercise for up to 150 minutes weekly; and  sleep for at least 7 hours a day.  - she is  advised to maintain close follow up with Doree Albee, MD for primary care needs, as well as her other providers for optimal  and coordinated care.  - Time spent on this patient care encounter:  35 min, of which > 50% was spent in  counseling and the rest reviewing her blood glucose logs , discussing her hypoglycemia and hyperglycemia episodes, reviewing her current and  previous labs / studies  ( including abstraction from other facilities) and medications  doses and developing a  long term treatment plan and documenting her care.   Please refer to Patient Instructions for Blood Glucose Monitoring and Insulin/Medications Dosing Guide"  in media tab for additional information. Please  also refer to " Patient Self Inventory" in the Media  tab for reviewed elements of pertinent patient history.  Alba Cory Dooling participated in the discussions, expressed understanding, and voiced agreement with the above plans.  All questions were answered to her satisfaction. she is encouraged to contact clinic should she have any questions or concerns prior to her return visit.  Follow up plan: - Return in about 9 weeks (around 01/01/2020) for Bring Meter and Logs- A1c in Office.  Glade Lloyd, MD [facility] [address] [place], [place] [address] Phone: [phone]  Fax: [phone]    10/30/2019, 5:58 PM  This note was partially dictated with voice recognition software. Similar sounding words can be transcribed inadequately or may not  be corrected upon review.

## 2019-10-30 NOTE — Progress Notes (Signed)
  Medical Nutrition Therapy:  Appt start time: 1415 end time:  1445   Assessment:  Primary concerns today: Type 2 DM, Obesity. Here with her Gma.Jannet Mantis not eat sweets.  She is post partum 4 months. Here to see Dr. Fransico Him, Endocrinology. .  Doing better with eating better balanced meals. Still craves sweets at time. BS are in the 200's now.  Willing to change eating habits and exercise to help with weight loss and improve DM. Drinking more water and eating more vegetables..  Lab Results  Component Value Date   HGBA1C 11.0 (H) 09/26/2019    Preferred Learning Style:   No preference indicated   Learning Readiness:   Ready  Change in progress   MEDICATIONS:    DIETARY INTAKE:   Eating lunch and dinner better. Still struggles with breakfast.   Usual physical activity: Walks some  Estimated energy needs: 1200 calories 135 g carbohydrates 90 g protein 44 g fat  Progress Towards Goal(s):  In progress.   Nutritional Diagnosis:  NB-1.1 Food and nutrition-related knowledge deficit As related to DIabetes Type 2 .  As evidenced by A1C 11%.     Intervention:  Nutrition and Diabetes education provided on My Plate, CHO counting, meal planning, portion sizes, timing of meals, avoiding snacks between meals unless having a low blood sugar, target ranges for A1C and blood sugars, signs/symptoms and treatment of hyper/hypoglycemia, monitoring blood sugars, taking medications as prescribed, benefits of exercising 30 minutes per day and prevention of complications of DM.   Goals Follow My Plate Eat 3 balanced meals at times discussed Don'[t skip meals Cut out snacks unless veggies. Drink only water Walk 60 minutes 3-4 times per week. Get A1C down to 7%.  Teaching Method Utilized:  Visual Auditory Hands on  Handouts given during visit include:  The Plate Method   Diabetes Instructions   Barriers to learning/adherence to lifestyle change: none  Demonstrated degree of  understanding via:  Teach Back   Monitoring/Evaluation:  Dietary intake, exercise, , and body weight in 1 month(s).

## 2019-10-30 NOTE — Patient Instructions (Signed)

## 2019-11-13 ENCOUNTER — Ambulatory Visit (INDEPENDENT_AMBULATORY_CARE_PROVIDER_SITE_OTHER): Payer: Medicaid Other | Admitting: Internal Medicine

## 2019-11-23 ENCOUNTER — Ambulatory Visit (INDEPENDENT_AMBULATORY_CARE_PROVIDER_SITE_OTHER): Payer: Medicaid Other | Admitting: Internal Medicine

## 2020-01-02 ENCOUNTER — Encounter (INDEPENDENT_AMBULATORY_CARE_PROVIDER_SITE_OTHER): Payer: Self-pay | Admitting: Internal Medicine

## 2020-01-02 ENCOUNTER — Encounter: Payer: Self-pay | Admitting: "Endocrinology

## 2020-01-02 ENCOUNTER — Other Ambulatory Visit: Payer: Self-pay

## 2020-01-02 ENCOUNTER — Encounter: Payer: Medicaid Other | Attending: "Endocrinology | Admitting: Nutrition

## 2020-01-02 ENCOUNTER — Ambulatory Visit (INDEPENDENT_AMBULATORY_CARE_PROVIDER_SITE_OTHER): Payer: Medicaid Other | Admitting: Internal Medicine

## 2020-01-02 ENCOUNTER — Ambulatory Visit (INDEPENDENT_AMBULATORY_CARE_PROVIDER_SITE_OTHER): Payer: Medicaid Other | Admitting: "Endocrinology

## 2020-01-02 VITALS — BP 130/80 | HR 108 | Temp 97.1°F | Resp 18 | Ht 65.0 in | Wt 203.0 lb

## 2020-01-02 VITALS — BP 119/86 | HR 111 | Ht 64.0 in | Wt 202.0 lb

## 2020-01-02 VITALS — Ht 64.0 in | Wt 202.0 lb

## 2020-01-02 DIAGNOSIS — E1165 Type 2 diabetes mellitus with hyperglycemia: Secondary | ICD-10-CM

## 2020-01-02 DIAGNOSIS — R350 Frequency of micturition: Secondary | ICD-10-CM

## 2020-01-02 DIAGNOSIS — I10 Essential (primary) hypertension: Secondary | ICD-10-CM | POA: Diagnosis present

## 2020-01-02 DIAGNOSIS — B369 Superficial mycosis, unspecified: Secondary | ICD-10-CM

## 2020-01-02 DIAGNOSIS — E669 Obesity, unspecified: Secondary | ICD-10-CM | POA: Diagnosis present

## 2020-01-02 DIAGNOSIS — E782 Mixed hyperlipidemia: Secondary | ICD-10-CM

## 2020-01-02 LAB — POCT GLYCOSYLATED HEMOGLOBIN (HGB A1C): Hemoglobin A1C: 10 % — AB (ref 4.0–5.6)

## 2020-01-02 MED ORDER — LABETALOL HCL 100 MG PO TABS: 100.0000 mg | ORAL_TABLET | Freq: Every day | ORAL | 1 refills | Status: AC

## 2020-01-02 MED ORDER — METFORMIN HCL 1000 MG PO TABS: 1000.0000 mg | ORAL_TABLET | Freq: Two times a day (BID) | ORAL | 1 refills | Status: DC

## 2020-01-02 MED ORDER — CLOTRIMAZOLE-BETAMETHASONE 1-0.05 % EX CREA: 1.0000 "application " | TOPICAL_CREAM | Freq: Two times a day (BID) | CUTANEOUS | 0 refills | Status: DC

## 2020-01-02 MED ORDER — GLIPIZIDE 5 MG PO TABS: 5.0000 mg | ORAL_TABLET | Freq: Two times a day (BID) | ORAL | 3 refills | Status: DC

## 2020-01-02 NOTE — Progress Notes (Signed)
  Medical Nutrition Therapy:  Appt start time: 1340 end time:  1400   Assessment:  Primary concerns today: Type 2 DM, Obesity.   Working on cutting out a sweets. Has been eating more celery and broccoli and more chicken and steak. Still snacks. FBS: 120-150's    A1C still 10%. Needs to be more active and cut out sweets, snacks and sugary beverages. Lost 4 lbs.  Complains of urine being darker and bubbly/foamy. Referred her to her PCP to follow up. Blood sugars high contributing to that most likely. Metformin 1000 mg BID Glipizide 10 mg daily.  Lab Results  Component Value Date   HGBA1C 10.0 (A) 01/02/2020    Preferred Learning Style:   No preference indicated   Learning Readiness:   Ready  Change in progress   MEDICATIONS:    DIETARY INTAKE:   B) eggs, BLT and french fries and pink lemonade-sugared. L) spinach salad, cukes, cheese, tomatoes, onion, with ranch dressing. S) Steak,  Broccoli, celery, water   Usual physical activity: Walks some  Estimated energy needs: 1200 calories 135 g carbohydrates 90 g protein 44 g fat  Progress Towards Goal(s):  In progress.   Nutritional Diagnosis:  NB-1.1 Food and nutrition-related knowledge deficit As related to DIabetes Type 2 .  As evidenced by A1C 11%.     Intervention:  Nutrition and Diabetes education provided on My Plate, CHO counting, meal planning, portion sizes, timing of meals, avoiding snacks between meals unless having a low blood sugar, target ranges for A1C and blood sugars, signs/symptoms and treatment of hyper/hypoglycemia, monitoring blood sugars, taking medications as prescribed, benefits of exercising 30 minutes per day and prevention of complications of DM.  Goals  Exercise: walk for 60 minutes 3-4 times per week. Increase fruit with meals Eat 30 g CHO at meals. Avoid snacks between meals. Eat more balanced meals at lunch Lose 1 lb per week  Get A1C down to 7%   Teaching Method Utilized:   Visual Auditory Hands on  Handouts given during visit include:  The Plate Method   Diabetes Instructions   Barriers to learning/adherence to lifestyle change: none  Demonstrated degree of understanding via:  Teach Back   Monitoring/Evaluation:  Dietary intake, exercise, , and body weight in 3 month(s).

## 2020-01-02 NOTE — Progress Notes (Signed)
01/02/2020, 2:55 PM  Endocrinology follow-up note   Subjective:    Patient ID: Monica Jennings, female    DOB: [birth year].  Monica Jennings is being seen in follow-up after she was seen in consultation for  management of currently uncontrolled symptomatic diabetes requested by  Wilson SingerGosrani, Nimish C, MD.   Past Medical History:  Diagnosis Date  . ADHD (attention deficit hyperactivity disorder)   . Diabetes (HCC)   . Hyperlipidemia   . Hypertension   . Migraines   . Vitamin D deficiency     Past Surgical History:  Procedure Laterality Date  . NO PAST SURGERIES      Social History   Socioeconomic History  . Marital status: Single    Spouse name: Not on file  . Number of children: 1  . Years of education: Not on file  . Highest education level: Not on file  Occupational History  . Occupation: Daycare  Tobacco Use  . Smoking status: Never Smoker  . Smokeless tobacco: Never Used  Substance and Sexual Activity  . Alcohol use: No  . Drug use: No  . Sexual activity: Yes    Birth control/protection: None  Other Topics Concern  . Not on file  Social History Narrative  . Not on file   Social Determinants of Health   Financial Resource Strain:   . Difficulty of Paying Living Expenses:   Food Insecurity:   . Worried About Programme researcher, broadcasting/film/videounning Out of Food in the Last Year:   . Baristaan Out of Food in the Last Year:   Transportation Needs:   . Freight forwarderLack of Transportation (Medical):   Marland Kitchen. Lack of Transportation (Non-Medical):   Physical Activity:   . Days of Exercise per Week:   . Minutes of Exercise per Session:   Stress:   . Feeling of Stress :   Social Connections:   . Frequency of Communication with Friends and Family:   . Frequency of Social Gatherings with Friends and Family:   . Attends Religious Services:   . Active Member of Clubs or Organizations:   . Attends BankerClub or Organization Meetings:   Marland Kitchen. Marital Status:     Family History  Problem Relation Age  of Onset  . ADD / ADHD Brother   . Diabetes Maternal Grandfather   . Hypertension Maternal Grandfather     Outpatient Encounter Medications as of 01/02/2020  Medication Sig  . albuterol (VENTOLIN HFA) 108 (90 Base) MCG/ACT inhaler Inhale 1-2 puffs into the lungs every 6 (six) hours as needed for wheezing or shortness of breath (cough).  . cholecalciferol (VITAMIN D3) 25 MCG (1000 UT) tablet Take 5,000 Units by mouth daily.  . folic acid (FOLVITE) 400 MCG tablet Take 400 mcg by mouth daily.  Marland Kitchen. glipiZIDE (GLUCOTROL) 5 MG tablet Take 1 tablet (5 mg total) by mouth 2 (two) times daily before a meal.  . ibuprofen (ADVIL,MOTRIN) 400 MG tablet Take 1 tablet (400 mg total) by mouth every 6 (six) hours as needed.  . labetalol (NORMODYNE) 100 MG tablet Take 1 tablet (100 mg total) by mouth daily.  . metFORMIN (GLUCOPHAGE) 1000 MG tablet Take 1 tablet (1,000 mg total) by mouth 2 (two) times daily with a meal.  . omega-3 acid ethyl esters (LOVAZA) 1 g capsule Take 1,000 mg by mouth daily.  . [DISCONTINUED] glipiZIDE (GLUCOTROL) 5 MG tablet Take 1 tablet (5 mg total) by mouth 2 (two) times daily before a meal.  . [  DISCONTINUED] labetalol (NORMODYNE) 100 MG tablet Take 1 tablet (100 mg total) by mouth daily.  . [DISCONTINUED] metFORMIN (GLUCOPHAGE) 1000 MG tablet Take 1 tablet (1,000 mg total) by mouth 2 (two) times daily with a meal.   No facility-administered encounter medications on file as of 01/02/2020.    ALLERGIES: Allergies  Allergen Reactions  . Augmentin [Amoxicillin-Pot Clavulanate]     Nausea and vomiting  . Sulfamethoxazole Other (See Comments)  . Penicillins Nausea And Vomiting and Other (See Comments)    Unknown Nausea and vomiting Unknown     VACCINATION STATUS: Immunization History  Administered Date(s) Administered  . Influenza,inj,Quad PF,6+ Mos 06/05/2019    Diabetes She presents for her follow-up diabetic visit. She has type 2 diabetes mellitus. Her disease course has  been improving. There are no hypoglycemic associated symptoms. Pertinent negatives for hypoglycemia include no confusion, headaches, pallor or seizures. Associated symptoms include fatigue. Pertinent negatives for diabetes include no blurred vision, no chest pain, no polydipsia, no polyphagia and no polyuria. There are no hypoglycemic complications. Symptoms are improving. There are no diabetic complications. Risk factors for coronary artery disease include diabetes mellitus, dyslipidemia, family history, hypertension, obesity and sedentary lifestyle. Current diabetic treatments: She is currently on meds for meet 1000 mg p.o. twice daily, glipizide 5 mg p.o. twice daily. Her weight is decreasing steadily. She is following a generally unhealthy diet. When asked about meal planning, she reported none. She rarely participates in exercise. Her home blood glucose trend is decreasing steadily. Her breakfast blood glucose range is generally 110-130 mg/dl. Her bedtime blood glucose range is generally 140-180 mg/dl. Her overall blood glucose range is 140-180 mg/dl. (She brought a log book showing tightening fasting  blood glucose readings, her POC a1c is 10% improving from  11% on October 03, 2019.) An ACE inhibitor/angiotensin II receptor blocker is not being taken. Eye exam is not current.  Hyperlipidemia This is a chronic problem. The current episode started more than 1 year ago. The problem is uncontrolled. Exacerbating diseases include diabetes and obesity. Pertinent negatives include no chest pain, myalgias or shortness of breath. Current antihyperlipidemic treatment includes bile acid squestrants. Risk factors for coronary artery disease include family history, dyslipidemia, diabetes mellitus, obesity, hypertension and a sedentary lifestyle.  Hypertension This is a chronic problem. The current episode started more than 1 year ago. Pertinent negatives include no blurred vision, chest pain, headaches, palpitations  or shortness of breath. Risk factors for coronary artery disease include dyslipidemia, diabetes mellitus, family history, obesity and sedentary lifestyle. Past treatments include beta blockers.     Review of Systems  Constitutional: Positive for fatigue. Negative for chills, fever and unexpected weight change.  HENT: Negative for trouble swallowing and voice change.   Eyes: Negative for blurred vision and visual disturbance.  Respiratory: Negative for cough, shortness of breath and wheezing.   Cardiovascular: Negative for chest pain, palpitations and leg swelling.  Gastrointestinal: Negative for diarrhea, nausea and vomiting.  Endocrine: Negative for cold intolerance, heat intolerance, polydipsia, polyphagia and polyuria.  Musculoskeletal: Negative for arthralgias and myalgias.  Skin: Negative for color change, pallor, rash and wound.  Neurological: Negative for seizures and headaches.  Psychiatric/Behavioral: Negative for confusion and suicidal ideas.    Objective:    Vitals with BMI 01/02/2020 01/02/2020 10/30/2019  Height 5\' 4"  5\' 4"  5\' 4"   Weight 202 lbs 202 lbs 206 lbs 13 oz  BMI 34.66 16.10 96.04  Systolic - 540 981  Diastolic - 86 78  Pulse - 111  94    BP 119/86   Pulse (!) 111   Ht 5\' 4"  (1.626 m)   Wt 202 lb (91.6 kg)   BMI 34.67 kg/m   Wt Readings from Last 3 Encounters:  01/02/20 202 lb (91.6 kg)  01/02/20 202 lb (91.6 kg)  10/30/19 206 lb 12.8 oz (93.8 kg)     Physical Exam Constitutional:      Appearance: She is well-developed.  HENT:     Head: Normocephalic and atraumatic.  Neck:     Thyroid: No thyromegaly.     Trachea: No tracheal deviation.  Cardiovascular:     Rate and Rhythm: Normal rate and regular rhythm.  Pulmonary:     Effort: Pulmonary effort is normal.  Abdominal:     General: Abdomen is flat.     Palpations: Abdomen is soft.     Tenderness: There is no abdominal tenderness. There is no guarding.  Musculoskeletal:        General: Normal  range of motion.     Cervical back: Normal range of motion and neck supple.  Skin:    General: Skin is warm and dry.     Coloration: Skin is not pale.     Findings: No erythema or rash.     Comments: Acanthosis nigricans on her neck.  Neurological:     Mental Status: She is alert and oriented to person, place, and time.     Cranial Nerves: No cranial nerve deficit.     Coordination: Coordination normal.     Deep Tendon Reflexes: Reflexes are normal and symmetric.  Psychiatric:        Judgment: Judgment normal.     CMP     Component Value Date/Time   NA 137 09/26/2019 0915   K 4.1 09/26/2019 0915   CL 100 09/26/2019 0915   CO2 23 09/26/2019 0915   GLUCOSE 327 (H) 09/26/2019 0915   BUN 11 09/26/2019 0915   CREATININE 0.69 09/26/2019 0915   CALCIUM 9.7 09/26/2019 0915   PROT 7.4 09/26/2019 0915   ALBUMIN 3.6 02/04/2010 0906   AST 61 (H) 09/26/2019 0915   ALT 82 (H) 09/26/2019 0915   ALKPHOS 105 02/04/2010 0906   BILITOT 0.4 09/26/2019 0915   GFRNONAA 123 09/26/2019 0915   GFRAA 142 09/26/2019 0915     Diabetic Labs (most recent): Lab Results  Component Value Date   HGBA1C 10.0 (A) 01/02/2020   HGBA1C 11.0 (H) 09/26/2019   HGBA1C 7.4 (H) 06/05/2019     Lipid Panel ( most recent) Lipid Panel     Component Value Date/Time   CHOL 164 09/26/2019 0915   TRIG 822 (H) 09/26/2019 0915   HDL 27 (L) 09/26/2019 0915   CHOLHDL 6.1 (H) 09/26/2019 0915   LDLCALC  09/26/2019 0915     Comment:     . LDL cholesterol not calculated. Triglyceride levels greater than 400 mg/dL invalidate calculated LDL results. . Reference range: <100 . Desirable range <100 mg/dL for primary prevention;   <70 mg/dL for patients with CHD or diabetic patients  with > or = 2 CHD risk factors. 09/28/2019 LDL-C is now calculated using the Martin-Hopkins  calculation, which is a validated novel method providing  better accuracy than the Friedewald equation in the  estimation of LDL-C.  Marland Kitchen et  al. Horald Pollen. Lenox Ahr): 2061-2068  (http://education.[facility].com/faq/FAQ164)       Lab Results  Component Value Date   TSH 2.68 06/05/2019      Assessment & Plan:  1. Uncontrolled type 2 diabetes mellitus with hyperglycemia (HCC)  - Neetu C Gutierrez has currently uncontrolled symptomatic type 2 DM since  24 years of age - Recent labs reviewed. She brought a log book showing tightening fasting  blood glucose readings, her POC a1c is 10% improving from  11% on September 26, 2019. However she did not bring her meter to review. -She made significant change in her diet and presents with significantly improved glycemic profile near target both fasting and postprandial.   -her diabetes is complicated by obesity/sedentary life and she remains at a high risk for more acute and chronic complications which include CAD, CVA, CKD, retinopathy, and neuropathy. These are all discussed in detail with her.  - I have counseled her on diet  and weight management  by adopting a carbohydrate restricted/protein rich diet. Patient is encouraged to switch to  unprocessed or minimally processed     complex starch and increased protein intake (animal or plant source), fruits, and vegetables. -  she is advised to stick to a routine mealtimes to eat 3 meals  a day and avoid unnecessary snacks ( to snack only to correct hypoglycemia).   - she  admits there is a room for improvement in her diet and drink choices. -  Suggestion is made for her to avoid simple carbohydrates  from her diet including Cakes, Sweet Desserts / Pastries, Ice Cream, Soda (diet and regular), Sweet Tea, Candies, Chips, Cookies, Sweet Pastries,  Store Bought Juices, Alcohol in Excess of  1-2 drinks a day, Artificial Sweeteners, Coffee Creamer, and "Sugar-free" Products. This will help patient to have stable blood glucose profile and potentially avoid unintended weight gain.  - she will be scheduled with Norm Salt, RDN, CDE for  diabetes education.  - I have approached her with the following individualized plan to manage  her diabetes and patient agrees:   -Patient with questionable compliance for monitoring of blood glucose.   She may need at least basal insulin in order for her to control diabetes to target, however she needs to engage for proper monitoring of blood glucose for safe use of insulin.  -In the meantime, she is advised to continue Metformin 1000 mg p.o. daily, continue glipizide 5 mg p.o. twice daily.    - she is encouraged to call clinic for blood glucose levels less than 70 or above 200 mg /dl. - she will be considered for incretin therapy as appropriate next visit.  - Specific targets for  A1c;  LDL, HDL,  and Triglycerides were discussed with the patient.  2) Blood Pressure /Hypertension: Her blood pressure is controlled to target.she is advised to continue her current medications including labetalol 100 mg p.o. daily with breakfast .  3) Lipids/Hyperlipidemia:   Review of her recent lipid panel showed un controlled hypertriglyceridemia.  She is not on statins, advised to continue Lovaza 1 g p.o. twice daily.   She will be considered for low-dose Crestor on subsequent visits.  4) vitamin D deficiency: She is on ongoing vitamin D supplement, currently on vitamin D3 5000 units daily.  She is advised to continue.  5)  Weight/Diet:  Body mass index is 34.67 kg/m.  -   clearly complicating her diabetes care.   she is  a candidate for weight loss. I discussed with her the fact that loss of 5 - 10% of her  current body weight will have the most impact on her diabetes management.  Exercise, and detailed carbohydrates information provided  -  detailed on discharge instructions.  5) Chronic Care/Health Maintenance:  -she  Is not on ACEI/ARB and Statin medications,   is encouraged to initiate and continue to follow up with Ophthalmology, Dentist,  Podiatrist at least yearly or according to recommendations,  and advised to   stay away from smoking. I have recommended yearly flu vaccine and pneumonia vaccine at least every 5 years; moderate intensity exercise for up to 150 minutes weekly; and  sleep for at least 7 hours a day.  - she is  advised to maintain close follow up with Wilson Singer, MD for primary care needs, as well as her other providers for optimal and coordinated care.  - Time spent on this patient care encounter:  35 min, of which > 50% was spent in  counseling and the rest reviewing her blood glucose logs , discussing her hypoglycemia and hyperglycemia episodes, reviewing her current and  previous labs / studies  ( including abstraction from other facilities) and medications  doses and developing a  long term treatment plan and documenting her care.   Please refer to Patient Instructions for Blood Glucose Monitoring and Insulin/Medications Dosing Guide"  in media tab for additional information. Please  also refer to " Patient Self Inventory" in the Media  tab for reviewed elements of pertinent patient history.  Monica Shorter Pankowski participated in the discussions, expressed understanding, and voiced agreement with the above plans.  All questions were answered to her satisfaction. she is encouraged to contact clinic should she have any questions or concerns prior to her return visit.   Follow up plan: - Return in about 3 months (around 04/03/2020) for F/U with Pre-visit Labs, Meter, Logs, A1c here.Marquis Lunch, MD [facility] [address] [place], [place] [address] Phone: [phone]  Fax: [phone]    01/02/2020, 2:55 PM  This note was partially dictated with voice recognition software. Similar sounding words can be transcribed inadequately or may not  be corrected upon review.

## 2020-01-02 NOTE — Progress Notes (Signed)
Metrics: Intervention Frequency ACO  Documented Smoking Status Yearly  Screened one or more times in 24 months  Cessation Counseling or  Active cessation medication Past 24 months  Past 24 months   Guideline developer: UpToDate (See UpToDate for funding source) Date Released: 2014       Wellness Office Visit  Subjective:  Patient ID: Monica Jennings, female    DOB: 25-Jan-1996  Age: 24 y.o. MRN: [id number]  CC: This lady comes in for follow-up after a long hiatus.  She has type 2 diabetes which is being followed by endocrinology.  She also has hypertension and obesity. HPI She was seen by endocrinology today and according to her the hemoglobin A1c had improved down to 10%. She continues on labetalol for her hypertension. She is complaining of ringlike lesions on her skin in various parts of her body. She also has frequency of micturition and an odor to her urine.  She wonders whether she might have a UTI.  Past Medical History:  Diagnosis Date  . ADHD (attention deficit hyperactivity disorder)   . Diabetes ([place])   . Hyperlipidemia   . Hypertension   . Migraines   . Vitamin D deficiency    Past Surgical History:  Procedure Laterality Date  . NO PAST SURGERIES       Family History  Problem Relation Age of Onset  . ADD / ADHD Brother   . Diabetes Maternal Grandfather   . Hypertension Maternal Grandfather     Social History   Social History Narrative  . Not on file   Social History   Tobacco Use  . Smoking status: Never Smoker  . Smokeless tobacco: Never Used  Substance Use Topics  . Alcohol use: No    Current Meds  Medication Sig  . albuterol (VENTOLIN HFA) 108 (90 Base) MCG/ACT inhaler Inhale 1-2 puffs into the lungs every 6 (six) hours as needed for wheezing or shortness of breath (cough).  . cholecalciferol (VITAMIN D3) 25 MCG (1000 UT) tablet Take 5,000 Units by mouth daily.  . folic acid (FOLVITE) 376 MCG tablet Take 400 mcg by mouth daily.  Marland Kitchen glipiZIDE  (GLUCOTROL) 5 MG tablet Take 1 tablet (5 mg total) by mouth 2 (two) times daily before a meal.  . ibuprofen (ADVIL,MOTRIN) 400 MG tablet Take 1 tablet (400 mg total) by mouth every 6 (six) hours as needed.  . labetalol (NORMODYNE) 100 MG tablet Take 1 tablet (100 mg total) by mouth daily.  . metFORMIN (GLUCOPHAGE) 1000 MG tablet Take 1 tablet (1,000 mg total) by mouth 2 (two) times daily with a meal.  . omega-3 acid ethyl esters (LOVAZA) 1 g capsule Take 1,000 mg by mouth daily.       Depression screen [facility] 2/9 09/26/2019 07/11/2019  Decreased Interest 0 0  Down, Depressed, Hopeless 0 0  PHQ - 2 Score 0 0     Objective:   Today's Vitals: BP 130/80 (BP Location: Right Arm, Patient Position: Sitting, Cuff Size: Normal)   Pulse (!) 108   Temp (!) 97.1 F (36.2 C)   Resp 18   Ht 5\' 5"  (1.651 m)   Wt 203 lb (92.1 kg)   SpO2 98%   BMI 33.78 kg/m  Vitals with BMI 01/02/2020 01/02/2020 01/02/2020  Height 5\' 5"  5\' 4"  5\' 4"   Weight 203 lbs 202 lbs 202 lbs  BMI 33.78 28.31 51.76  Systolic 160 - 737  Diastolic 80 - 86  Pulse 106 - 111  Physical Exam  She remains obese.  Blood pressure is reasonably well controlled.  She is alert and orientated without any gross focal neurological signs.  Inspection of her skin appears to show fungal ringworm type lesions.  This is on her right upper arm, right leg and the thigh area.     Assessment   1. Type 2 diabetes mellitus with hyperglycemia, without long-term current use of insulin (HCC)   2. Hypertension, unspecified type   3. Frequency of urination   4. Fungal infection of skin       Tests ordered Orders Placed This Encounter  Procedures  . Urinalysis w microscopic + reflex cultur     Plan: 1. Urine will be sent for microscopy and possible culture depend on the urinalysis. 2. She will continue with glipizide and Metformin for diabetes.  This is being monitored by the endocrinologist. 3. She will continue with Lipitor for  hypertension which seems to be controlling her blood pressure well. 4. Her obesity seems to be a constant feature and she needs to lose weight.  She is receiving advice from the nutritionist with the endocrinology practice. 5. I will prescribe for her Lotrisone cream for the probable fungal infection her skin. 6. Follow-up with Maralyn Sago in 6 months time.   Meds ordered this encounter  Medications  . clotrimazole-betamethasone (LOTRISONE) cream    Sig: Apply 1 application topically 2 (two) times daily.    Dispense:  30 g    Refill:  0    Sulay Brymer Normajean Glasgow, MD

## 2020-01-02 NOTE — Patient Instructions (Addendum)
Goals  Exercise: walk for 60 minutes 3-4 times per week. Increase fruit with meals Eat 30 g CHO at meals. Avoid snacks between meals. Eat more balanced meals at lunch Lose 1 lb per week  Get A1C down to 7%

## 2020-01-02 NOTE — Patient Instructions (Signed)

## 2020-01-03 LAB — URINALYSIS W MICROSCOPIC + REFLEX CULTURE
Bacteria, UA: NONE SEEN /HPF
Bilirubin Urine: NEGATIVE
Hgb urine dipstick: NEGATIVE
Hyaline Cast: NONE SEEN /LPF
Leukocyte Esterase: NEGATIVE
Nitrites, Initial: NEGATIVE
RBC / HPF: NONE SEEN /HPF (ref 0–2)
Specific Gravity, Urine: 1.042 — ABNORMAL HIGH (ref 1.001–1.03)
Squamous Epithelial / HPF: NONE SEEN /HPF (ref <=5)
WBC, UA: NONE SEEN /HPF (ref 0–5)
pH: <=5 (ref 5.0–8.0)

## 2020-01-03 LAB — NO CULTURE INDICATED

## 2020-01-03 NOTE — Progress Notes (Signed)
Please call patient and let her know that her urinalysis does not indicate evidence of urine infection.  She does have a lot of glucose in the urine which may indicate why she is having increased frequency of urination due to poor control of diabetes.If she continues to get symptoms, she can make another appointment to see [place] again next week.

## 2020-01-03 NOTE — Progress Notes (Signed)
Patient called. let her know that her urinalysis does not indicate evidence of urine infection. She does have a lot of glucose in the urine which may indicate why she is having increased frequency of urination due to poor control of diabetes. If she continues to get symptoms, she can make another appointment to see [place] again next week. Pt will come tomorrow @ 4:20 pm.

## 2020-01-04 ENCOUNTER — Encounter (INDEPENDENT_AMBULATORY_CARE_PROVIDER_SITE_OTHER): Payer: Self-pay | Admitting: Internal Medicine

## 2020-01-04 ENCOUNTER — Other Ambulatory Visit: Payer: Self-pay

## 2020-01-04 ENCOUNTER — Ambulatory Visit (INDEPENDENT_AMBULATORY_CARE_PROVIDER_SITE_OTHER): Payer: Medicaid Other | Admitting: Internal Medicine

## 2020-01-04 VITALS — BP 100/66 | HR 95 | Resp 15 | Ht 64.0 in | Wt 197.8 lb

## 2020-01-04 DIAGNOSIS — R350 Frequency of micturition: Secondary | ICD-10-CM

## 2020-01-04 MED ORDER — FLUCONAZOLE 150 MG PO TABS
150.0000 mg | ORAL_TABLET | Freq: Once | ORAL | 0 refills | Status: AC
Start: 2020-01-04 — End: 2020-01-04

## 2020-01-04 NOTE — Progress Notes (Signed)
Probably 1 to metrics: Intervention Frequency ACO  Documented Smoking Status Yearly  Screened one or more times in 24 months  Cessation Counseling or  Active cessation medication Past 24 months  Past 24 months   Guideline developer: UpToDate (See UpToDate for funding source) Date Released: 2014       Wellness Office Visit  Subjective:  Patient ID: Monica Jennings, female    DOB: 10/27/1995  Age: 24 y.o. MRN: [id number]  CC: This lady comes in again with symptoms that are somewhat unusual with what she describes as bubbling in her urine. HPI  I seen her for the symptoms few days ago and urinalysis indicated glycosuria but no evidence of UTI and there was no need for culture.  She still continues to have symptoms.  She has some itching in the area.  She denies dysuria or pain. Past Medical History:  Diagnosis Date  . ADHD (attention deficit hyperactivity disorder)   . Diabetes (HCC)   . Hyperlipidemia   . Hypertension   . Migraines   . Vitamin D deficiency    Past Surgical History:  Procedure Laterality Date  . NO PAST SURGERIES       Family History  Problem Relation Age of Onset  . ADD / ADHD Brother   . Diabetes Maternal Grandfather   . Hypertension Maternal Grandfather     Social History   Social History Narrative  . Not on file   Social History   Tobacco Use  . Smoking status: Never Smoker  . Smokeless tobacco: Never Used  Substance Use Topics  . Alcohol use: No    Current Meds  Medication Sig  . albuterol (VENTOLIN HFA) 108 (90 Base) MCG/ACT inhaler Inhale 1-2 puffs into the lungs every 6 (six) hours as needed for wheezing or shortness of breath (cough).  . cholecalciferol (VITAMIN D3) 25 MCG (1000 UT) tablet Take 5,000 Units by mouth daily.  . clotrimazole-betamethasone (LOTRISONE) cream Apply 1 application topically 2 (two) times daily.  . folic acid (FOLVITE) 400 MCG tablet Take 400 mcg by mouth daily.  Marland Kitchen glipiZIDE (GLUCOTROL) 5 MG tablet Take 1  tablet (5 mg total) by mouth 2 (two) times daily before a meal.  . ibuprofen (ADVIL,MOTRIN) 400 MG tablet Take 1 tablet (400 mg total) by mouth every 6 (six) hours as needed.  . labetalol (NORMODYNE) 100 MG tablet Take 1 tablet (100 mg total) by mouth daily.  . metFORMIN (GLUCOPHAGE) 1000 MG tablet Take 1 tablet (1,000 mg total) by mouth 2 (two) times daily with a meal.  . omega-3 acid ethyl esters (LOVAZA) 1 g capsule Take 1,000 mg by mouth daily.     Depression screen [facility] 2/9 01/04/2020 09/26/2019 07/11/2019  Decreased Interest 0 0 0  Down, Depressed, Hopeless 0 0 0  PHQ - 2 Score 0 0 0     Objective:   Today's Vitals: BP 100/66   Pulse 95   Resp 15   Ht 5\' 4"  (1.626 m)   Wt 197 lb 12.8 oz (89.7 kg)   SpO2 98%   BMI 33.95 kg/m  Vitals with BMI 01/04/2020 01/02/2020 01/02/2020  Height 5\' 4"  5\' 5"  5\' 4"   Weight 197 lbs 13 oz 203 lbs 202 lbs  BMI 33.94 33.78 34.66  Systolic 100 130 -  Diastolic 66 80 -  Pulse 95 108 -     Physical Exam    She looks systemically well and definitely not in pain.  She is not toxic or septic  clinically.   Assessment   1. Frequency of urination       Tests ordered No orders of the defined types were placed in this encounter.    Plan: 1.  I am not sure the nature of her symptoms.  Suspect this is a fungal infection or may just be that she has forceful urination causing the problems. 2.She is going to make an appointment to see the OB/GYN also and I think this is a good idea. 3.  She will follow-up as scheduled in November with [place].   Meds ordered this encounter  Medications  . fluconazole (DIFLUCAN) 150 MG tablet    Sig: Take 1 tablet (150 mg total) by mouth once for 1 dose.    Dispense:  3 tablet    Refill:  0    Nancyann Cotterman Luther Parody, MD

## 2020-01-09 ENCOUNTER — Encounter: Payer: Self-pay | Admitting: Nutrition

## 2020-03-20 ENCOUNTER — Ambulatory Visit (INDEPENDENT_AMBULATORY_CARE_PROVIDER_SITE_OTHER): Payer: Medicaid Other | Admitting: Orthopedic Surgery

## 2020-03-20 DIAGNOSIS — M25572 Pain in left ankle and joints of left foot: Secondary | ICD-10-CM | POA: Diagnosis not present

## 2020-03-21 ENCOUNTER — Encounter: Payer: Self-pay | Admitting: Orthopedic Surgery

## 2020-03-21 NOTE — Progress Notes (Signed)
Office Visit Note   Patient: Monica Jennings           Date of Birth: 07-Sep-1995           MRN: [id number] Visit Date: 03/20/2020 Requested by: Wilson Singer, MD [address] [place],  [place] [address] PCP: Wilson Singer, MD  Subjective: Chief Complaint  Patient presents with  . foot/ankle pain    HPI: Patient presents with inversion injury to left foot and ankle 7 days ago.  Occurred while she was at work.  She slipped on a toy.  Reports some pain with weightbearing as well as pain at the base of the fifth metatarsal.  Went to the emergency department and advised that it was a sprain.  Given a wrap and crutches.  She feels like the foot region wants to pop.  Has not taken any medication for the problem.  Is having weight-bear on the heel.              ROS: All systems reviewed are negative as they relate to the chief complaint within the history of present illness.  Patient denies  fevers or chills.   Assessment & Plan: Visit Diagnoses:  1. Pain in left ankle and joints of left foot     Plan: Impression is base of the fifth metatarsal pain with no fracture on radiographic review.  No real tenderness over the ATFL or CFL.  I would put her in a short fracture boot weightbearing as tolerated for 2 weeks and then she can transition to regular shoes.  This should be a self-limited problem.  Rest ice compression elevation encouraged through the weekend.  Follow-up as needed  Follow-Up Instructions: Return if symptoms worsen or fail to improve.   Orders:  No orders of the defined types were placed in this encounter.  No orders of the defined types were placed in this encounter.     Procedures: No procedures performed   Clinical Data: No additional findings.  Objective: Vital Signs: There were no vitals taken for this visit.  Physical Exam:   Constitutional: Patient appears well-developed HEENT:  Head: Normocephalic Eyes:EOM are normal Neck: Normal range of  motion Cardiovascular: Normal rate Pulmonary/chest: Effort normal Neurologic: Patient is alert Skin: Skin is warm Psychiatric: Patient has normal mood and affect    Ortho Exam: Ortho exam demonstrates antalgic gait to the left with tenderness to palpation of the base of the fifth metatarsal.  Inversion eversion dorsiflexion plantarflexion strength 5+ out of 5.  She does have tenderness around the bone but less tenderness over the peroneal tendon.  No tenderness over the CFL or ATFL.  No tenderness over the malleolus medially or laterally.  Specialty Comments:  No specialty comments available.  Imaging: No results found.   PMFS History: Patient Active Problem List   Diagnosis Date Noted  . Uncontrolled type 2 diabetes mellitus with hyperglycemia (HCC) 10/17/2019  . Heartburn 07/11/2019  . Abdominal pain 07/11/2019  . Tachycardia 07/11/2019  . Encounter for general adult medical examination with abnormal findings 07/11/2019  . Needs flu shot 06/05/2019  . Migraine 06/05/2019  . Cough 06/05/2019  . Diabetes (HCC)   . Vitamin D deficiency   . Essential hypertension, benign   . Mixed hyperlipidemia   . Constipation 10/25/2018   Past Medical History:  Diagnosis Date  . ADHD (attention deficit hyperactivity disorder)   . Diabetes (HCC)   . Hyperlipidemia   . Hypertension   . Migraines   .  Vitamin D deficiency     Family History  Problem Relation Age of Onset  . ADD / ADHD Brother   . Diabetes Maternal Grandfather   . Hypertension Maternal Grandfather     Past Surgical History:  Procedure Laterality Date  . NO PAST SURGERIES     Social History   Occupational History  . Occupation: Daycare  Tobacco Use  . Smoking status: Never Smoker  . Smokeless tobacco: Never Used  Substance and Sexual Activity  . Alcohol use: No  . Drug use: No  . Sexual activity: Yes    Birth control/protection: None

## 2020-04-09 ENCOUNTER — Ambulatory Visit: Payer: Medicaid Other | Admitting: Nutrition

## 2020-04-09 ENCOUNTER — Ambulatory Visit: Payer: Medicaid Other | Admitting: Nurse Practitioner

## 2020-06-06 ENCOUNTER — Other Ambulatory Visit: Payer: Self-pay

## 2020-06-06 ENCOUNTER — Encounter (INDEPENDENT_AMBULATORY_CARE_PROVIDER_SITE_OTHER): Payer: Self-pay | Admitting: Nurse Practitioner

## 2020-06-06 ENCOUNTER — Ambulatory Visit (INDEPENDENT_AMBULATORY_CARE_PROVIDER_SITE_OTHER): Payer: Medicaid Other | Admitting: Nurse Practitioner

## 2020-06-06 VITALS — BP 144/92 | HR 103 | Temp 96.9°F | Ht 64.0 in | Wt 187.4 lb

## 2020-06-06 DIAGNOSIS — E559 Vitamin D deficiency, unspecified: Secondary | ICD-10-CM

## 2020-06-06 DIAGNOSIS — Z23 Encounter for immunization: Secondary | ICD-10-CM

## 2020-06-06 DIAGNOSIS — Z0000 Encounter for general adult medical examination without abnormal findings: Secondary | ICD-10-CM

## 2020-06-06 DIAGNOSIS — F419 Anxiety disorder, unspecified: Secondary | ICD-10-CM | POA: Diagnosis not present

## 2020-06-06 DIAGNOSIS — R109 Unspecified abdominal pain: Secondary | ICD-10-CM

## 2020-06-06 DIAGNOSIS — L0292 Furuncle, unspecified: Secondary | ICD-10-CM

## 2020-06-06 DIAGNOSIS — R350 Frequency of micturition: Secondary | ICD-10-CM

## 2020-06-06 DIAGNOSIS — E1165 Type 2 diabetes mellitus with hyperglycemia: Secondary | ICD-10-CM | POA: Diagnosis not present

## 2020-06-06 DIAGNOSIS — R748 Abnormal levels of other serum enzymes: Secondary | ICD-10-CM

## 2020-06-06 DIAGNOSIS — R079 Chest pain, unspecified: Secondary | ICD-10-CM

## 2020-06-06 LAB — POCT URINALYSIS DIPSTICK
Bilirubin, UA: NEGATIVE
Blood, UA: NEGATIVE
Glucose, UA: POSITIVE — AB
Leukocytes, UA: NEGATIVE
Nitrite, UA: NEGATIVE
Protein, UA: POSITIVE — AB
Spec Grav, UA: 1.015 (ref 1.010–1.025)
Urobilinogen, UA: 0.2 E.U./dL
pH, UA: 6 (ref 5.0–8.0)

## 2020-06-06 LAB — POCT URINE PREGNANCY: Preg Test, Ur: NEGATIVE

## 2020-06-06 LAB — PREGNANCY, URINE: Preg Test, Ur: NEGATIVE

## 2020-06-06 LAB — GLUCOSE, POCT (MANUAL RESULT ENTRY): POC Glucose: 358 mg/dl — AB (ref 70–99)

## 2020-06-06 MED ORDER — GLIPIZIDE 10 MG PO TABS: 10.0000 mg | ORAL_TABLET | Freq: Two times a day (BID) | ORAL | 0 refills | Status: DC

## 2020-06-06 MED ORDER — HYDROXYZINE HCL 25 MG PO TABS: 25.0000 mg | ORAL_TABLET | Freq: Three times a day (TID) | ORAL | 0 refills | Status: DC | PRN

## 2020-06-06 MED ORDER — DOXYCYCLINE HYCLATE 100 MG PO TABS: 100.0000 mg | ORAL_TABLET | Freq: Two times a day (BID) | ORAL | 0 refills | Status: DC

## 2020-06-06 NOTE — Patient Instructions (Signed)
Call Dr. Fransico Him to set up appointment.

## 2020-06-06 NOTE — Progress Notes (Signed)
Subjective:  Patient ID: Monica Jennings, female    DOB: April 30, 1996  Age: 24 y.o. MRN: [id number]  CC:  Chief Complaint  Patient presents with  . Follow-up  . Urinary Frequency    left back pain, pressure, vaginal itching      HPI  This patient arrives today for the above.  She was originally scheduled for chronic care follow-up, but has multiple acute complaints today so this is going to be an acute visit.  Urinary frequency: She reports she is been having some urinary frequency and burning with urination over the last few days.  She does have a history of type 2 diabetes and has not followed up with her endocrinologist as scheduled.  She tells me she is been drinking quite a bit of cranberry juice over the last few days because she is concerned she is developing a urinary tract infection.  Left back pain: She also mentions that she is having some left flank pain and would like to be evaluated for this as well.  Elevated liver enzymes: Of note last blood work shows that she has had some elevated liver enzymes, she has not undergone ultrasound for further evaluation as of yet.  Boil to left leg: She also reports getting recurrence boils in furuncles in different areas of her body.  She tells me she has 1 currently to her left lower extremity.  She tells me she is been trying to soak it in warm water, but is not draining at this time.  It is quite swollen and tender to touch.  Anxiety: She also reports feeling more anxiety and irritability over the last few months.  Tells me she is not sleeping well at night and gets less than 4 hours of sleep in the evening.  She denies any suicidal ideation.  She tells me she does have a history of ADHD.  She is a bit tearful while discussing her mood during the exam today.  She also experiences some chest pressure especially when she is feeling anxious and is concerned about that.  Chest pain: As stated above she reports some chest pressure  especially when she is feeling anxious.  She would like further evaluation on that today.  She does not report pain, but does report some shortness of breath during these episodes.  The spontaneously resolve on her own with rest, but activity does not seem to elicit or worsen the symptoms.    Past Medical History:  Diagnosis Date  . ADHD (attention deficit hyperactivity disorder)   . Diabetes ([place])   . Hyperlipidemia   . Hypertension   . Migraines   . Vitamin D deficiency       Family History  Problem Relation Age of Onset  . ADD / ADHD Brother   . Diabetes Maternal Grandfather   . Hypertension Maternal Grandfather     Social History   Social History Narrative  . Not on file   Social History   Tobacco Use  . Smoking status: Never Smoker  . Smokeless tobacco: Never Used  Substance Use Topics  . Alcohol use: No     Current Meds  Medication Sig  . albuterol (VENTOLIN HFA) 108 (90 Base) MCG/ACT inhaler Inhale 1-2 puffs into the lungs every 6 (six) hours as needed for wheezing or shortness of breath (cough).  . cholecalciferol (VITAMIN D3) 25 MCG (1000 UT) tablet Take 5,000 Units by mouth daily.  . folic acid (FOLVITE) 224 MCG tablet Take 400  mcg by mouth daily.  Marland Kitchen glipiZIDE (GLUCOTROL) 10 MG tablet Take 1 tablet (10 mg total) by mouth 2 (two) times daily before a meal.  . ibuprofen (ADVIL,MOTRIN) 400 MG tablet Take 1 tablet (400 mg total) by mouth every 6 (six) hours as needed.  . labetalol (NORMODYNE) 100 MG tablet Take 1 tablet (100 mg total) by mouth daily.  . metFORMIN (GLUCOPHAGE) 1000 MG tablet Take 1 tablet (1,000 mg total) by mouth 2 (two) times daily with a meal.  . omega-3 acid ethyl esters (LOVAZA) 1 g capsule Take 1,000 mg by mouth daily.  . [DISCONTINUED] glipiZIDE (GLUCOTROL) 5 MG tablet Take 1 tablet (5 mg total) by mouth 2 (two) times daily before a meal.    ROS:  See HPI   Objective:   Today's Vitals: BP (!) 144/92   Pulse (!) 103   Temp (!)  96.9 F (36.1 C) (Temporal)   Ht _0  (1.626 m)   Wt 187 lb 6.4 oz (85 kg)   SpO2 97%   BMI 32.17 kg/m  Vitals with BMI 06/06/2020 01/04/2020 01/02/2020  Height _1  _2  _3   Weight 187 lbs 6 oz 197 lbs 13 oz 203 lbs  BMI 32.15 21.97 58.83  Systolic 254 982 641  Diastolic 92 66 80  Pulse 583 95 108     Physical Exam Vitals reviewed.  Constitutional:      General: She is not in acute distress.    Appearance: Normal appearance.  HENT:     Head: Normocephalic and atraumatic.  Neck:     Vascular: No carotid bruit.  Cardiovascular:     Rate and Rhythm: Normal rate and regular rhythm.     Pulses: Normal pulses.     Heart sounds: Normal heart sounds.  Pulmonary:     Effort: Pulmonary effort is normal.     Breath sounds: Normal breath sounds.  Skin:    General: Skin is warm and dry.       Neurological:     General: No focal deficit present.     Mental Status: She is alert and oriented to person, place, and time.  Psychiatric:        Mood and Affect: Mood normal.        Behavior: Behavior normal.        Judgment: Judgment normal.       EKG: NSR   Assessment and Plan   1. Urinary frequency   2. Type 2 diabetes mellitus with hyperglycemia, without long-term current use of insulin (HCC)   3. Vitamin D deficiency   4. Anxiety   5. Chest pain, unspecified type   6. Furuncle   7. Elevated liver enzymes   8. Left flank pain   9. Need for influenza vaccination      Plan: 1.,  2. , 8. Urine positive for quite a bit of blood sugar, point-of-care blood glucose reading is above 350 today.  Will increase glipizide to 10 mg twice a day and recommend she follow-up with endocrinology.  She would like to see a different endocrinologist for second opinion, will refer her to Dr. Cruzita Lederer in [place].  We will check E9M and metabolic panel today.  She was told to avoid taking cranberry juice and to avoid any processed carbohydrates or other foods high in sugar.  If urine  results come back negative for UTI may consider ultrasound of kidneys for further evaluation of her flank pain. 3.  She will continue on her  vitamin D3 supplement we will check serum level today. 4.  Patient is open to working with virtual behavioral health, will send referral for this today.  We will also start her on hydroxyzine as needed for anxiety.  She was recommended to take this when she does not have to drive to for see how it makes her feel.  She was told that it can make her drowsy, she was told to let me know if she experiences any negative side effects with taking the medication. 5.  I believe that her chest pain is most likely related to her anxiety, she would like to be evaluated by cardiologist I will send referral for this today as well. 6.  We will treat with a course of doxycycline. 7.  We will collect CMP to further evaluation, if liver enzymes remain elevated we will send for ultrasound of liver. 9.  We will administer flu shot today.   Tests ordered Orders Placed This Encounter  Procedures  . Flu Vaccine QUAD 6+ mos PF IM (Fluarix Quad PF)  . CMP with eGFR([facility])  . Urinalysis with Culture Reflex  . Hemoglobin A1c  . Vitamin D, 25-hydroxy  . Pregnancy, urine  . Ambulatory referral to Cardiology  . Ambulatory referral to Psychiatry  . Ambulatory referral to Endocrinology  . POCT urinalysis dipstick  . POCT glucose (manual entry)      Meds ordered this encounter  Medications  . glipiZIDE (GLUCOTROL) 10 MG tablet    Sig: Take 1 tablet (10 mg total) by mouth 2 (two) times daily before a meal.    Dispense:  180 tablet    Refill:  0    Order Specific Question:   Supervising Provider    Answer:   Hurshel Party C [[id number]]  . hydrOXYzine (ATARAX/VISTARIL) 25 MG tablet    Sig: Take 1 tablet (25 mg total) by mouth 3 (three) times daily as needed for anxiety.    Dispense:  90 tablet    Refill:  0    Order Specific Question:   Supervising Provider    Answer:    Hurshel Party C [[id number]]  . doxycycline (VIBRA-TABS) 100 MG tablet    Sig: Take 1 tablet (100 mg total) by mouth 2 (two) times daily.    Dispense:  20 tablet    Refill:  0    Order Specific Question:   Supervising Provider    Answer:   Doree Albee [[id number]]    Patient to follow-up next week for close follow-up.  Ailene Ards, NP

## 2020-06-07 ENCOUNTER — Other Ambulatory Visit (INDEPENDENT_AMBULATORY_CARE_PROVIDER_SITE_OTHER): Payer: Self-pay | Admitting: Nurse Practitioner

## 2020-06-07 DIAGNOSIS — R109 Unspecified abdominal pain: Secondary | ICD-10-CM

## 2020-06-07 DIAGNOSIS — R748 Abnormal levels of other serum enzymes: Secondary | ICD-10-CM

## 2020-06-07 LAB — URINALYSIS W MICROSCOPIC + REFLEX CULTURE
Bacteria, UA: NONE SEEN /HPF
Bilirubin Urine: NEGATIVE
Hgb urine dipstick: NEGATIVE
Hyaline Cast: NONE SEEN /LPF
Ketones, ur: NEGATIVE
Leukocyte Esterase: NEGATIVE
Nitrites, Initial: NEGATIVE
Protein, ur: NEGATIVE
RBC / HPF: NONE SEEN /HPF (ref 0–2)
Specific Gravity, Urine: 1.044 — ABNORMAL HIGH (ref 1.001–1.03)
Squamous Epithelial / HPF: NONE SEEN /HPF (ref <=5)
pH: 6 (ref 5.0–8.0)

## 2020-06-07 LAB — COMPLETE METABOLIC PANEL WITH GFR
AG Ratio: 1.5 (calc) (ref 1.0–2.5)
ALT: 81 U/L — ABNORMAL HIGH (ref 6–29)
AST: 51 U/L — ABNORMAL HIGH (ref 10–30)
Albumin: 4.5 g/dL (ref 3.6–5.1)
Alkaline phosphatase (APISO): 67 U/L (ref 31–125)
BUN: 7 mg/dL (ref 7–25)
CO2: 28 mmol/L (ref 20–32)
Calcium: 9.9 mg/dL (ref 8.6–10.2)
Chloride: 102 mmol/L (ref 98–110)
Creat: 0.59 mg/dL (ref 0.50–1.10)
GFR, Est African American: 150 mL/min/{1.73_m2} (ref >=60)
GFR, Est Non African American: 129 mL/min/{1.73_m2} (ref >=60)
Globulin: 3 g/dL (calc) (ref 1.9–3.7)
Glucose, Bld: 332 mg/dL — ABNORMAL HIGH (ref 65–99)
Potassium: 4.3 mmol/L (ref 3.5–5.3)
Sodium: 139 mmol/L (ref 135–146)
Total Bilirubin: 0.5 mg/dL (ref 0.2–1.2)
Total Protein: 7.5 g/dL (ref 6.1–8.1)

## 2020-06-07 LAB — HEMOGLOBIN A1C
Hgb A1c MFr Bld: 11.3 % of total Hgb — ABNORMAL HIGH (ref <5.7)
Mean Plasma Glucose: 278 (calc)
eAG (mmol/L): 15.4 (calc)

## 2020-06-07 LAB — NO CULTURE INDICATED

## 2020-06-07 LAB — VITAMIN D 25 HYDROXY (VIT D DEFICIENCY, FRACTURES): Vit D, 25-Hydroxy: 17 ng/mL — ABNORMAL LOW (ref 30–100)

## 2020-06-07 NOTE — Progress Notes (Signed)
Orders placed for ultrasound of liver and kidneys. Please make sure this is run through insurance and scheduled.

## 2020-06-07 NOTE — Progress Notes (Signed)
Will work up ins on Monday.

## 2020-06-10 ENCOUNTER — Encounter (INDEPENDENT_AMBULATORY_CARE_PROVIDER_SITE_OTHER): Payer: Self-pay

## 2020-06-12 ENCOUNTER — Encounter (INDEPENDENT_AMBULATORY_CARE_PROVIDER_SITE_OTHER): Payer: Self-pay | Admitting: Nurse Practitioner

## 2020-06-12 ENCOUNTER — Telehealth: Payer: Self-pay

## 2020-06-12 ENCOUNTER — Ambulatory Visit (INDEPENDENT_AMBULATORY_CARE_PROVIDER_SITE_OTHER): Payer: Medicaid Other | Admitting: Nurse Practitioner

## 2020-06-12 ENCOUNTER — Other Ambulatory Visit: Payer: Self-pay

## 2020-06-12 VITALS — BP 158/88 | HR 90 | Temp 97.6°F | Resp 18 | Ht 65.0 in | Wt 185.0 lb

## 2020-06-12 DIAGNOSIS — R519 Headache, unspecified: Secondary | ICD-10-CM

## 2020-06-12 DIAGNOSIS — J029 Acute pharyngitis, unspecified: Secondary | ICD-10-CM | POA: Diagnosis not present

## 2020-06-12 DIAGNOSIS — E1165 Type 2 diabetes mellitus with hyperglycemia: Secondary | ICD-10-CM | POA: Diagnosis not present

## 2020-06-12 DIAGNOSIS — R748 Abnormal levels of other serum enzymes: Secondary | ICD-10-CM

## 2020-06-12 DIAGNOSIS — G4700 Insomnia, unspecified: Secondary | ICD-10-CM

## 2020-06-12 DIAGNOSIS — R0683 Snoring: Secondary | ICD-10-CM | POA: Diagnosis not present

## 2020-06-12 DIAGNOSIS — F419 Anxiety disorder, unspecified: Secondary | ICD-10-CM

## 2020-06-12 DIAGNOSIS — R109 Unspecified abdominal pain: Secondary | ICD-10-CM

## 2020-06-12 DIAGNOSIS — I10 Essential (primary) hypertension: Secondary | ICD-10-CM

## 2020-06-12 MED ORDER — HYDROXYZINE HCL 50 MG PO TABS: 50.0000 mg | ORAL_TABLET | Freq: Three times a day (TID) | ORAL | 2 refills | Status: DC | PRN

## 2020-06-12 NOTE — Progress Notes (Signed)
Subjective:  Patient ID: Monica Jennings, female    DOB: 28-May-1996  Age: 25 y.o. MRN: [id number]  CC:  Chief Complaint  Patient presents with   Headache   abcess on leg   Back Pain   Other    insomnia, elevated liver enzymes, sore throat   Diabetes      HPI  This patient arrives today for the above.  Headache: She has me she is been having a headache for approximately 1 week.  It is intermittent and seems to start at the back of her head and radiate up towards her scalp.  The headache appears to be worse in the morning and improves as the day goes on.  She denies any nausea or other neurologic abnormalities.  Leg abscess: She has an abscess to her left upper thigh she did go to urgent care to have this treated and did undergo I&D and was prescribed new antibiotics.  She tells me the pain is improved but the abscess continues to drain.  She tells me she is due to follow-up later this week to have it cleaned and repacked.  Flank pain: She continues to have flank pain and is scheduled to have ultrasound of the kidneys completed.  I did check urine at last office visit with me about a week ago and she was negative for urinary tract infection.  When she went to the emergency department for her leg abscess urine was checked at that time and did come back positive for UTI.  Patient does continue on antibiotics.  Insomnia: She mentions to me she is not sleeping well at night.  She is wondering what she can take to treat this.  She has taken melatonin in the past which helps her to fall asleep does not keep her asleep.  Elevated liver enzymes: She is awaiting to have abdominal ultrasound for further evaluation of her elevated liver enzymes.  Sore throat: She mentioned that she was having a sore throat about 7 to 10 days ago, this seems to have resolved on its own.  She has not been tested for strep.  Diabetes: She also has a history of type 2 diabetes with A1c 11.3 collected last  week.  We did increase her glipizide to 10 mg twice a day and tells me she has made this change and is tolerating the medicine well.  She has also been referred to a different endocrinologist per her request for assistance with managing her diabetes.  Past Medical History:  Diagnosis Date   ADHD (attention deficit hyperactivity disorder)    Diabetes (HCC)    Hyperlipidemia    Hypertension    Migraines    Vitamin D deficiency       Family History  Problem Relation Age of Onset   ADD / ADHD Brother    Diabetes Maternal Grandfather    Hypertension Maternal Grandfather     Social History   Social History Narrative   Not on file   Social History   Tobacco Use   Smoking status: Never Smoker   Smokeless tobacco: Never Used  Substance Use Topics   Alcohol use: No     Current Meds  Medication Sig   albuterol (VENTOLIN HFA) 108 (90 Base) MCG/ACT inhaler Inhale 1-2 puffs into the lungs every 6 (six) hours as needed for wheezing or shortness of breath (cough).   cholecalciferol (VITAMIN D3) 25 MCG (1000 UT) tablet Take 5,000 Units by mouth daily.   clindamycin (CLEOCIN)  150 MG capsule Take 2 capsules by mouth in the morning, at noon, and at bedtime.   doxycycline (VIBRA-TABS) 100 MG tablet Take 1 tablet (100 mg total) by mouth 2 (two) times daily.   folic acid (FOLVITE) 400 MCG tablet Take 400 mcg by mouth daily.   glipiZIDE (GLUCOTROL) 10 MG tablet Take 1 tablet (10 mg total) by mouth 2 (two) times daily before a meal.   hydrOXYzine (ATARAX/VISTARIL) 50 MG tablet Take 1 tablet (50 mg total) by mouth 3 (three) times daily as needed for anxiety.   ibuprofen (ADVIL,MOTRIN) 400 MG tablet Take 1 tablet (400 mg total) by mouth every 6 (six) hours as needed.   labetalol (NORMODYNE) 100 MG tablet Take 1 tablet (100 mg total) by mouth daily.   metFORMIN (GLUCOPHAGE) 1000 MG tablet Take 1 tablet (1,000 mg total) by mouth 2 (two) times daily with a meal.   omega-3  acid ethyl esters (LOVAZA) 1 g capsule Take 1,000 mg by mouth daily.   ondansetron (ZOFRAN-ODT) 4 MG disintegrating tablet Take 4 mg by mouth every 8 (eight) hours as needed.   [DISCONTINUED] hydrOXYzine (ATARAX/VISTARIL) 25 MG tablet Take 1 tablet (25 mg total) by mouth 3 (three) times daily as needed for anxiety.    ROS:  Review of Systems  Constitutional: Positive for malaise/fatigue. Negative for fever.  Eyes: Negative.   Respiratory: Negative.   Cardiovascular: Negative.   Gastrointestinal: Positive for diarrhea. Negative for nausea and vomiting.  Genitourinary: Positive for flank pain (left > right).  Neurological: Positive for headaches. Negative for dizziness and sensory change.  Psychiatric/Behavioral: The patient has insomnia.      Objective:   Today's Vitals: BP (!) 158/88 (BP Location: Left Arm, Patient Position: Sitting, Cuff Size: Normal)    Pulse 90    Temp 97.6 F (36.4 C) (Temporal)    Resp 18    Ht 5\' 5"  (1.651 m)    Wt 185 lb (83.9 kg)    SpO2 99%    BMI 30.79 kg/m  Vitals with BMI 06/12/2020 06/06/2020 01/04/2020  Height 5\' 5"  5\' 4"  5\' 4"   Weight 185 lbs 187 lbs 6 oz 197 lbs 13 oz  BMI 30.79 32.15 33.94  Systolic 158 144 01/06/2020  Diastolic 88 92 66  Pulse 90 103 95     Physical Exam     POC Strep: Negative  Assessment and Plan   1. Snoring   2. Sore throat   3. Type 2 diabetes mellitus with hyperglycemia, without long-term current use of insulin (HCC)   4. Elevated liver enzymes   5. Flank pain   6. Anxiety   7. Intractable headache, unspecified chronicity pattern, unspecified headache type   8. Essential hypertension, benign   9. Insomnia, unspecified type      Plan: 1., 6., 9,.  Will refer to neurology for sleep study for further evaluation of her snoring and insomnia.  Because she is feeling a bit more anxious than normal and having difficulty sleeping will increase her dose of hydroxyzine to 50 mg by mouth every 8 hours as needed. 2.   Negative for strep throat, resolved.  No further work-up recommended today at this time. 3.  She will continue on her increased dose of glipizide and will follow up with endocrinology once scheduled. 4.,  5.  She will undergo ultrasounds as scheduled. 7.  I think this will improve once her sleep improves as well.  If symptoms persist may consider additional work-up. 8.  Blood pressure  above goal today, however I believe this could be secondary to her pain that she is experiencing.  Will monitor closely but will not make changes to medication regimen for now.   Tests ordered Orders Placed This Encounter  Procedures   Ambulatory referral to Neurology      Meds ordered this encounter  Medications   hydrOXYzine (ATARAX/VISTARIL) 50 MG tablet    Sig: Take 1 tablet (50 mg total) by mouth 3 (three) times daily as needed for anxiety.    Dispense:  30 tablet    Refill:  2    Order Specific Question:   Supervising Provider    Answer:   Wilson Singer [[id number]]    Patient to follow-up in 1 month or sooner as needed.  Elenore Paddy, NP

## 2020-06-12 NOTE — Telephone Encounter (Signed)
Per Jiles Prows in [place]- pt needs to follow up for her A1C here with Dr Alm Bustard established here for her thyroid. Tried to reach pt/ no answer/no vm

## 2020-06-14 NOTE — Telephone Encounter (Signed)
Monica Jennings we need to close referral for this patient for Dr Fransico Him or is she going to see another endocrinologist? Please advise.

## 2020-06-17 ENCOUNTER — Ambulatory Visit (HOSPITAL_COMMUNITY)
Admission: RE | Admit: 2020-06-17 | Discharge: 2020-06-17 | Disposition: A | Discharge status: Home / Self Care | Payer: Medicaid Other | Source: Ambulatory Visit | Attending: Nurse Practitioner | Admitting: Nurse Practitioner

## 2020-06-17 ENCOUNTER — Other Ambulatory Visit: Payer: Self-pay

## 2020-06-17 DIAGNOSIS — R748 Abnormal levels of other serum enzymes: Secondary | ICD-10-CM | POA: Insufficient documentation

## 2020-06-17 DIAGNOSIS — R109 Unspecified abdominal pain: Secondary | ICD-10-CM | POA: Diagnosis present

## 2020-06-17 NOTE — Telephone Encounter (Signed)
Yes please . Sorry for the confusion there.  Thank you

## 2020-07-01 ENCOUNTER — Ambulatory Visit (INDEPENDENT_AMBULATORY_CARE_PROVIDER_SITE_OTHER): Payer: Medicaid Other | Admitting: Nurse Practitioner

## 2020-07-16 ENCOUNTER — Encounter: Payer: Self-pay | Admitting: Neurology

## 2020-07-16 ENCOUNTER — Ambulatory Visit: Payer: Medicaid Other | Admitting: Neurology

## 2020-07-16 VITALS — BP 145/103 | HR 108 | Ht 64.0 in | Wt 186.0 lb

## 2020-07-16 DIAGNOSIS — R030 Elevated blood-pressure reading, without diagnosis of hypertension: Secondary | ICD-10-CM

## 2020-07-16 DIAGNOSIS — E669 Obesity, unspecified: Secondary | ICD-10-CM

## 2020-07-16 DIAGNOSIS — R519 Headache, unspecified: Secondary | ICD-10-CM

## 2020-07-16 DIAGNOSIS — G4719 Other hypersomnia: Secondary | ICD-10-CM | POA: Diagnosis not present

## 2020-07-16 DIAGNOSIS — R0683 Snoring: Secondary | ICD-10-CM

## 2020-07-16 DIAGNOSIS — G4700 Insomnia, unspecified: Secondary | ICD-10-CM

## 2020-07-16 DIAGNOSIS — E66811 Obesity, class 1: Secondary | ICD-10-CM

## 2020-07-16 DIAGNOSIS — R351 Nocturia: Secondary | ICD-10-CM

## 2020-07-16 NOTE — Progress Notes (Signed)
Subjective:    Patient ID: Monica Jennings is a 24 y.o. female.  HPI     Huston Foley, MD, PhD [facility] [address] [address] [place], [place] [address]  Dear Maralyn Sago,   I saw your patient, Monica Jennings, upon your kind request, in my Sleep clinic today for initial consultation of her sleep disorder, in particular, concern for underlying obstructive sleep apnea.  The patient is unaccompanied today.  As you know, Monica Jennings is a 24 year old right-handed woman with an underlying medical history of ADHD, hypertension, hyperlipidemia, diabetes, migraines, vitamin D deficiency and borderline obesity, who reports Snoring and excessive daytime somnolence. She has had chronic difficulty initiating and maintaining sleep, since childhood.  I reviewed your office note from 06/12/2020.  She reports that she was on treatment for ADHD as a child until age 57 when she weaned herself off of her medication.  She was on clonidine at night and on Concerta during the day.  She would fall asleep in class if she had not slept well at night.  She reports having vivid dreams and a lot of dreams, typically not good dreams.  She has had sleep paralysis twice as far she can recall.  She does not have a family history of sleep apnea as far she knows.  She lives with her boyfriend and her 23-year-old son.  Her son's bedroom is on the other side of their trailer.  She does not have a baby monitor, he sleeps fairly well and she does not have to give him the bottle overnight.  She does not breast-feed.  She reports having had anxiety.  She takes hydroxyzine as needed and takes it almost every night.  She does snore.  She wakes up with a headache often.  She has nocturia about 4 times per average night.  She admits that her blood sugar control is not good.  She likes to drink regular soda and tea, about 5 servings on an average day, very minimal water.  Does not drink alcohol, tried it once.   She does not smoke cigarettes, and does not take any illicit drugs.  She works at a daycare.  Her sleep difficulty became worse when she got pregnant and also after the baby.  She had significant insomnia when she was pregnant.  She has difficulty maintaining sleep but also feels sleepy during the day.  Her A1c in late October 2021 was above 11.  She is supposed to see an endocrinologist.  Her bedtime is variable.  She will often go to bed between midnight and 1 AM.  Sometimes she falls asleep in the early evening and wakes up in the middle of the night, has then difficulty going back to sleep.  Her Past Medical History Is Significant For: Past Medical History:  Diagnosis Date  . ADHD (attention deficit hyperactivity disorder)   . Diabetes (HCC)   . Hyperlipidemia   . Hypertension   . Migraines   . Vitamin D deficiency     Her Past Surgical History Is Significant For: Past Surgical History:  Procedure Laterality Date  . NO PAST SURGERIES      Her Family History Is Significant For: Family History  Problem Relation Age of Onset  . ADD / ADHD Brother   . Diabetes Maternal Grandfather   . Hypertension Maternal Grandfather     Her Social History Is Significant For: Social History   Socioeconomic History  . Marital status: Single  Spouse name: Not on file  . Number of children: 1  . Years of education: Not on file  . Highest education level: Not on file  Occupational History  . Occupation: Daycare  Tobacco Use  . Smoking status: Never Smoker  . Smokeless tobacco: Never Used  Vaping Use  . Vaping Use: Former  Substance and Sexual Activity  . Alcohol use: No  . Drug use: No  . Sexual activity: Yes    Birth control/protection: None  Other Topics Concern  . Not on file  Social History Narrative  . Not on file   Social Determinants of Health   Financial Resource Strain:   . Difficulty of Paying Living Expenses: Not on file  Food Insecurity:   . Worried About Community education officer in the Last Year: Not on file  . Ran Out of Food in the Last Year: Not on file  Transportation Needs:   . Lack of Transportation (Medical): Not on file  . Lack of Transportation (Non-Medical): Not on file  Physical Activity:   . Days of Exercise per Week: Not on file  . Minutes of Exercise per Session: Not on file  Stress:   . Feeling of Stress : Not on file  Social Connections:   . Frequency of Communication with Friends and Family: Not on file  . Frequency of Social Gatherings with Friends and Family: Not on file  . Attends Religious Services: Not on file  . Active Member of Clubs or Organizations: Not on file  . Attends Banker Meetings: Not on file  . Marital Status: Not on file    Her Allergies Are:  Allergies  Allergen Reactions  . Augmentin [Amoxicillin-Pot Clavulanate]     Nausea and vomiting  . Sulfamethoxazole Other (See Comments)  . Penicillins Nausea And Vomiting and Other (See Comments)    Unknown Nausea and vomiting Unknown  Unknown Unknown Nausea and vomiting Unknown  :   Her Current Medications Are:  Outpatient Encounter Medications as of 07/16/2020  Medication Sig  . albuterol (VENTOLIN HFA) 108 (90 Base) MCG/ACT inhaler Inhale 1-2 puffs into the lungs every 6 (six) hours as needed for wheezing or shortness of breath (cough).  . cholecalciferol (VITAMIN D3) 25 MCG (1000 UT) tablet Take 5,000 Units by mouth daily.  . folic acid (FOLVITE) 400 MCG tablet Take 400 mcg by mouth daily.  Marland Kitchen glipiZIDE (GLUCOTROL) 10 MG tablet Take 1 tablet (10 mg total) by mouth 2 (two) times daily before a meal.  . hydrOXYzine (ATARAX/VISTARIL) 50 MG tablet Take 1 tablet (50 mg total) by mouth 3 (three) times daily as needed for anxiety.  Marland Kitchen ibuprofen (ADVIL,MOTRIN) 400 MG tablet Take 1 tablet (400 mg total) by mouth every 6 (six) hours as needed.  . labetalol (NORMODYNE) 100 MG tablet Take 1 tablet (100 mg total) by mouth daily.  Marland Kitchen levonorgestrel (MIRENA)  20 MCG/24HR IUD 1 Intra Uterine Device by Intrauterine route.  . metFORMIN (GLUCOPHAGE) 1000 MG tablet Take 1 tablet (1,000 mg total) by mouth 2 (two) times daily with a meal.  . omega-3 acid ethyl esters (LOVAZA) 1 g capsule Take 1,000 mg by mouth daily.  . [DISCONTINUED] doxycycline (VIBRA-TABS) 100 MG tablet Take 1 tablet (100 mg total) by mouth 2 (two) times daily.   No facility-administered encounter medications on file as of 07/16/2020.  :  Review of Systems:  Out of a complete 14 point review of systems, all are reviewed and negative with the exception  of these symptoms as listed below: Review of Systems  Neurological:       Pt presents today to discuss her sleep. Pt has never had a sleep study. Pt does endorse snoring.  Epworth Sleepiness Scale 0= would never doze 1= slight chance of dozing 2= moderate chance of dozing 3= high chance of dozing  Sitting and reading: 0 Watching TV: 3 Sitting inactive in a public place (ex. Theater or meeting): 3 As a passenger in a car for an hour without a break: 3 Lying down to rest in the afternoon: 3 Sitting and talking to someone: 0 Sitting quietly after lunch (no alcohol): 3 In a car, while stopped in traffic: 1 Total: 16     Objective:  Neurological Exam  Physical Exam Physical Examination:   Vitals:   07/16/20 0840  BP: (!) 145/103  Pulse: (!) 108   General Examination: The patient is a very pleasant 24 y.o. female in no acute distress. She appears well-developed and well-nourished and well groomed.   HEENT: Normocephalic, atraumatic, pupils are equal, round and reactive to light, extraocular tracking is good without limitation to gaze excursion or nystagmus noted. Hearing is grossly intact. Face is symmetric with normal facial animation. Speech is clear with no dysarthria noted. There is no hypophonia. There is no lip, neck/head, jaw or voice tremor. Neck is supple with full range of passive and active motion. There are no  carotid bruits on auscultation. Oropharynx exam reveals: mild mouth dryness, adequate dental hygiene and mild airway crowding, due to Small airway entry, tonsils 1+, left side easier to see than right.  Mallampati class II.  Tongue protrudes centrally and palate elevates symmetrically.  Neck circumference of 15-1/2 inches.  Chest: Clear to auscultation without wheezing, rhonchi or crackles noted.  Heart: S1+S2+0, regular and normal without murmurs, rubs or gallops noted.   Abdomen: Soft, non-tender and non-distended with normal bowel sounds appreciated on auscultation.  Extremities: There is no pitting edema in the distal lower extremities bilaterally.   Skin: Warm and dry without trophic changes noted.   Musculoskeletal: exam reveals no obvious joint deformities, tenderness or joint swelling or erythema.   Neurologically:  Mental status: The patient is awake, alert and oriented in all 4 spheres. Her immediate and remote memory, attention, language skills and fund of knowledge are appropriate. There is no evidence of aphasia, agnosia, apraxia or anomia. Speech is clear with normal prosody and enunciation. Thought process is linear. Mood is normal and affect is normal.  Cranial nerves II - XII are as described above under HEENT exam.  Motor exam: Normal bulk, strength and tone is noted. There is no tremor, Romberg is negative. Fine motor skills and coordination: grossly intact.  Cerebellar testing: No dysmetria or intention tremor. There is no truncal or gait ataxia.  Sensory exam: intact to light touch in the upper and lower extremities.  Gait, station and balance: She stands easily. No veering to one side is noted. No leaning to one side is noted. Posture is age-appropriate and stance is narrow based. Gait shows normal stride length and normal pace. No problems turning are noted. Tandem walk is unremarkable.                Assessment and Plan:   In summary, Bertram SavinSantashia C Kimmey is a very  pleasant 24 y.o.-year old female with an underlying medical history of ADHD, hypertension, hyperlipidemia, diabetes, migraines, vitamin D deficiency and borderline obesity, who presents for evaluation of her sleep disturbance  including difficulty maintaining sleep, some difficulty initiating sleep at times, snoring, sleep disruption.  History is not telltale for narcolepsy, she may have underlying sleep disordered breathing given her history of snoring, morning headaches and nocturia.  She also has suboptimal blood sugar/diabetes control and is strongly encouraged to work on improving her diabetes management and weight control.  She is advised primarily for now to work on reducing her sugary drinks and increasing her water intake.  We talked about evaluation for sleep apnea as well as evaluation for a sleepiness disorder such as narcolepsy.  For now, we will focus on excluding sleep disordered breathing as this would be more likely in my opinion.  She is willing to be evaluated with a sleep study.   I had a long chat with the patient about my findings and the diagnosis of OSA, its prognosis and treatment options. We talked about medical treatments, surgical interventions and non-pharmacological approaches. I explained in particular the risks and ramifications of untreated moderate to severe OSA, especially with respect to developing cardiovascular disease down the Road, including congestive heart failure, difficult to treat hypertension, cardiac arrhythmias, or stroke. Even type 2 diabetes has, in part, been linked to untreated OSA. Symptoms of untreated OSA include daytime sleepiness, memory problems, mood irritability and mood disorder such as depression and anxiety, lack of energy, as well as recurrent headaches, especially morning headaches. We talked about trying to maintain a healthy lifestyle in general, as well as the importance of weight control. We also talked about the importance of good sleep hygiene.  I recommended the following at this time: sleep study.   I explained the sleep test procedure to the patient. I also explained the CPAP treatment option to the patient, who indicated that she would be willing to try CPAP if the need arises.  I answered all her questions today and the patient was in agreement. I plan to see her back after the sleep study is completed and encouraged her to call with any interim questions, concerns, problems or updates.   Thank you very much for allowing me to participate in the care of this nice patient. If I can be of any further assistance to you please do not hesitate to call me at [phone].  Sincerely,   Huston Foley, MD, PhD

## 2020-07-16 NOTE — Patient Instructions (Addendum)

## 2020-07-25 ENCOUNTER — Ambulatory Visit (INDEPENDENT_AMBULATORY_CARE_PROVIDER_SITE_OTHER): Payer: Medicaid Other | Admitting: Internal Medicine

## 2020-07-25 ENCOUNTER — Other Ambulatory Visit: Payer: Self-pay

## 2020-07-25 ENCOUNTER — Encounter (INDEPENDENT_AMBULATORY_CARE_PROVIDER_SITE_OTHER): Payer: Self-pay | Admitting: Internal Medicine

## 2020-07-25 VITALS — BP 130/88 | HR 105 | Temp 97.2°F | Ht 64.0 in | Wt 188.0 lb

## 2020-07-25 DIAGNOSIS — R21 Rash and other nonspecific skin eruption: Secondary | ICD-10-CM | POA: Diagnosis not present

## 2020-07-25 DIAGNOSIS — R350 Frequency of micturition: Secondary | ICD-10-CM | POA: Diagnosis not present

## 2020-07-25 DIAGNOSIS — R102 Pelvic and perineal pain: Secondary | ICD-10-CM

## 2020-07-25 DIAGNOSIS — E1165 Type 2 diabetes mellitus with hyperglycemia: Secondary | ICD-10-CM

## 2020-07-25 DIAGNOSIS — I10 Essential (primary) hypertension: Secondary | ICD-10-CM | POA: Diagnosis not present

## 2020-07-25 LAB — POCT URINE PREGNANCY: Preg Test, Ur: NEGATIVE

## 2020-07-25 NOTE — Progress Notes (Signed)
Metrics: Intervention Frequency ACO  Documented Smoking Status Yearly  Screened one or more times in 24 months  Cessation Counseling or  Active cessation medication Past 24 months  Past 24 months   Guideline developer: UpToDate (See UpToDate for funding source) Date Released: 2014       Wellness Office Visit  Subjective:  Patient ID: Monica Jennings, female    DOB: July 07, 1996  Age: 24 y.o. MRN: [id number]  CC: This lady comes in because she thinks she might be pregnant. HPI  She has an IUD in place so it is difficult for her to know regarding her cycles.  She does feel some breast tenderness, some symptoms that she associates with pregnancy previously. She is a type II diabetic and her last hemoglobin A1c showed uncontrolled diabetes.  She apparently does have an appointment with another endocrinologist in the next 2 to 3 weeks time in the beginning of the year in [place].  Unfortunately I do not see an appointment in the [facility] health system so I suspect that it is another endocrinologist outside of the system. She also complains of a skin rash that she has had for several years.  She has not seen a dermatologist previously. Past Medical History:  Diagnosis Date  . ADHD (attention deficit hyperactivity disorder)   . Diabetes (HCC)   . Hyperlipidemia   . Hypertension   . Migraines   . Vitamin D deficiency    Past Surgical History:  Procedure Laterality Date  . NO PAST SURGERIES       Family History  Problem Relation Age of Onset  . ADD / ADHD Brother   . Diabetes Maternal Grandfather   . Hypertension Maternal Grandfather     Social History   Social History Narrative  . Not on file   Social History   Tobacco Use  . Smoking status: Never Smoker  . Smokeless tobacco: Never Used  Substance Use Topics  . Alcohol use: No    Current Meds  Medication Sig  . albuterol (VENTOLIN HFA) 108 (90 Base) MCG/ACT inhaler Inhale 1-2 puffs into the lungs every 6 (six) hours as  needed for wheezing or shortness of breath (cough).  . cholecalciferol (VITAMIN D3) 25 MCG (1000 UT) tablet Take 5,000 Units by mouth daily.  . folic acid (FOLVITE) 400 MCG tablet Take 400 mcg by mouth daily.  Marland Kitchen glipiZIDE (GLUCOTROL) 10 MG tablet Take 1 tablet (10 mg total) by mouth 2 (two) times daily before a meal.  . hydrOXYzine (ATARAX/VISTARIL) 50 MG tablet Take 1 tablet (50 mg total) by mouth 3 (three) times daily as needed for anxiety.  Marland Kitchen ibuprofen (ADVIL,MOTRIN) 400 MG tablet Take 1 tablet (400 mg total) by mouth every 6 (six) hours as needed.  . labetalol (NORMODYNE) 100 MG tablet Take 1 tablet (100 mg total) by mouth daily.  Marland Kitchen levonorgestrel (MIRENA) 20 MCG/24HR IUD 1 Intra Uterine Device by Intrauterine route.  . metFORMIN (GLUCOPHAGE) 1000 MG tablet Take 1 tablet (1,000 mg total) by mouth 2 (two) times daily with a meal.  . omega-3 acid ethyl esters (LOVAZA) 1 g capsule Take 1,000 mg by mouth daily.      Depression screen [facility] 2/9 01/04/2020 09/26/2019 07/11/2019  Decreased Interest 0 0 0  Down, Depressed, Hopeless 0 0 0  PHQ - 2 Score 0 0 0     Objective:   Today's Vitals: BP 130/88   Pulse (!) 105   Temp (!) 97.2 F (36.2 C) (Temporal)   Ht  5\' 4"  (1.626 m)   Wt 188 lb (85.3 kg)   SpO2 97%   BMI 32.27 kg/m  Vitals with BMI 07/25/2020 07/16/2020 06/12/2020  Height 5\' 4"  5\' 4"  5\' 5"   Weight 188 lbs 186 lbs 185 lbs  BMI 32.25 31.91 30.79  Systolic 130 145 13/10/2019  Diastolic 88 103 88  Pulse 105 90     Physical Exam   She looks systemically well, remains obese.  Blood pressure elevated diastolically but systolic is actually normal which is better than it was previously.  She appears to have scattered pale lesions on both her arms, variable measurements but at least 1 cm in diameter, not raised.   Assessment   1. Essential hypertension, benign   2. Uncontrolled type 2 diabetes mellitus with hyperglycemia (HCC)   3. Skin rash       Tests ordered Orders Placed  This Encounter  Procedures  . Ambulatory referral to Dermatology     Plan: 1. She will continue with labetalol for hypertension. 2. She will continue with Metformin and glipizide for diabetes for the time being. 3. I will send her to a dermatologist regarding her skin lesions. 4. We did do a urine pregnancy test and this was negative.  I told her that if she persists in the symptoms, she should call again as the negative test may be early in the pregnancy. 5. She will follow-up with in about 3 months.   No orders of the defined types were placed in this encounter.   998, MD

## 2020-08-07 ENCOUNTER — Telehealth: Payer: Self-pay

## 2020-08-07 NOTE — Telephone Encounter (Signed)
LVM for pt to call me back to schedule sleep study  

## 2020-08-19 ENCOUNTER — Encounter: Payer: Self-pay | Admitting: Cardiology

## 2020-08-19 ENCOUNTER — Ambulatory Visit (INDEPENDENT_AMBULATORY_CARE_PROVIDER_SITE_OTHER): Payer: Medicaid Other | Admitting: Cardiology

## 2020-08-19 ENCOUNTER — Other Ambulatory Visit: Payer: Self-pay

## 2020-08-19 VITALS — BP 122/90 | HR 91 | Ht 64.0 in | Wt 190.0 lb

## 2020-08-19 DIAGNOSIS — E782 Mixed hyperlipidemia: Secondary | ICD-10-CM | POA: Diagnosis not present

## 2020-08-19 DIAGNOSIS — R079 Chest pain, unspecified: Secondary | ICD-10-CM | POA: Diagnosis not present

## 2020-08-19 DIAGNOSIS — I10 Essential (primary) hypertension: Secondary | ICD-10-CM

## 2020-08-19 DIAGNOSIS — E119 Type 2 diabetes mellitus without complications: Secondary | ICD-10-CM

## 2020-08-19 NOTE — Patient Instructions (Signed)
Medication Instructions:  The current medical regimen is effective;  continue present plan and medications.  *If you need a refill on your cardiac medications before your next appointment, please call your pharmacy*  Testing/Procedures: Your physician has requested that you have an echocardiogram. Echocardiography is a painless test that uses sound waves to create images of your heart. It provides your doctor with information about the size and shape of your heart and how well your heart's chambers and valves are working. This procedure takes approximately one hour. There are no restrictions for this procedure.  Follow-Up: At [facility], you and your health needs are our priority.  As part of our continuing mission to provide you with exceptional heart care, we have created designated Provider Care Teams.  These Care Teams include your primary Cardiologist (physician) and Advanced Practice Providers (APPs -  Physician Assistants and Nurse Practitioners) who all work together to provide you with the care you need, when you need it.  We recommend signing up for the patient portal called "MyChart".  Sign up information is provided on this After Visit Summary.  MyChart is used to connect with patients for Virtual Visits (Telemedicine).  Patients are able to view lab/test results, encounter notes, upcoming appointments, etc.  Non-urgent messages can be sent to your provider as well.   To learn more about what you can do with MyChart, go to [email or web address].    Your next appointment:   Further follow up will be based on the results of the above testing.  Thank you for choosing [facility]!!

## 2020-08-19 NOTE — Progress Notes (Signed)
Cardiology Office Note:    Date:  08/19/2020   ID:  Monica Jennings, DOB [birth year], MRN [id number]  PCP:  Wilson Singer, MD  [facility] Cardiologist:  No primary care provider on file.  [facility] Electrophysiologist:  None   Referring MD: Monica Paddy, NP     History of Present Illness:    Monica Jennings is a 25 y.o. female here for the evaluation of chest pain at the request of Monica Prows, NP.  Has diabetes with hemoglobin A1c previously of 11.3, HTN well.  HR has been fast, stabbing chest pain, one month. Comes and goes. Left side goes numb. SOB with stairs.   Prior fainting. 25 yrsold dig.  GERD - soda.   No tob.   Adopted. Biological father had DM, HTN.   Past Medical History:  Diagnosis Date  . ADHD (attention deficit hyperactivity disorder)   . Diabetes (HCC)   . Hyperlipidemia   . Hypertension   . Migraines   . Vitamin D deficiency     Past Surgical History:  Procedure Laterality Date  . NO PAST SURGERIES      Current Medications: Current Meds  Medication Sig  . albuterol (VENTOLIN HFA) 108 (90 Base) MCG/ACT inhaler Inhale 1-2 puffs into the lungs every 6 (six) hours as needed for wheezing or shortness of breath (cough).  . cholecalciferol (VITAMIN D3) 25 MCG (1000 UT) tablet Take 5,000 Units by mouth daily.  . folic acid (FOLVITE) 400 MCG tablet Take 400 mcg by mouth daily.  Marland Kitchen glipiZIDE (GLUCOTROL) 10 MG tablet Take 1 tablet (10 mg total) by mouth 2 (two) times daily before a meal.  . hydrOXYzine (ATARAX/VISTARIL) 50 MG tablet Take 1 tablet (50 mg total) by mouth 3 (three) times daily as needed for anxiety.  Marland Kitchen ibuprofen (ADVIL,MOTRIN) 400 MG tablet Take 1 tablet (400 mg total) by mouth every 6 (six) hours as needed.  . labetalol (NORMODYNE) 100 MG tablet Take 1 tablet (100 mg total) by mouth daily.  . metFORMIN (GLUCOPHAGE) 1000 MG tablet Take 1 tablet (1,000 mg total) by mouth 2 (two) times daily with a meal.  . omega-3 acid ethyl  esters (LOVAZA) 1 g capsule Take 1,000 mg by mouth daily.     Allergies:   Augmentin [amoxicillin-pot clavulanate], Sulfa antibiotics, Sulfamethoxazole, and Penicillins   Social History   Socioeconomic History  . Marital status: Single    Spouse name: Not on file  . Number of children: 1  . Years of education: Not on file  . Highest education level: Not on file  Occupational History  . Occupation: Daycare  Tobacco Use  . Smoking status: Never Smoker  . Smokeless tobacco: Never Used  Vaping Use  . Vaping Use: Former  Substance and Sexual Activity  . Alcohol use: No  . Drug use: No  . Sexual activity: Yes    Birth control/protection: None  Other Topics Concern  . Not on file  Social History Narrative  . Not on file   Social Determinants of Health   Financial Resource Strain: Not on file  Food Insecurity: Not on file  Transportation Needs: Not on file  Physical Activity: Not on file  Stress: Not on file  Social Connections: Not on file     Family History: The patient's family history includes ADD / ADHD in her brother; Diabetes in her maternal grandfather; Hypertension in her maternal grandfather.  ROS:   Please see the history of present illness.  No fevers chills bleeding, no recent syncope all other systems reviewed and are negative.  EKGs/Labs/Other Studies Reviewed:    The following studies were reviewed today: Prior office notes reviewed  EKG:  EKG is  ordered today.  The ekg ordered today demonstrates sinus rhythm 91 with subtle J-point elevation noted  Recent Labs: 06/06/2020: ALT 81; BUN 7; Creat 0.59; Potassium 4.3; Sodium 139  Recent Lipid Panel    Component Value Date/Time   CHOL 164 09/26/2019 0915   TRIG 822 (H) 09/26/2019 0915   HDL 27 (L) 09/26/2019 0915   CHOLHDL 6.1 (H) 09/26/2019 0915   LDLCALC  09/26/2019 0915     Comment:     . LDL cholesterol not calculated. Triglyceride levels greater than 400 mg/dL invalidate calculated LDL  results. . Reference range: <100 . Desirable range <100 mg/dL for primary prevention;   <70 mg/dL for patients with CHD or diabetic patients  with > or = 2 CHD risk factors. Marland Kitchen LDL-C is now calculated using the Martin-Hopkins  calculation, which is a validated novel method providing  better accuracy than the Friedewald equation in the  estimation of LDL-C.  Horald Pollen et al. Lenox Ahr. 1884;166(06): 2061-2068  (http://education.[employer].com/faq/FAQ164)      Risk Assessment/Calculations:       Physical Exam:    VS:  BP 122/90   Pulse 91   Ht 5\' 4"  (1.626 m)   Wt 190 lb (86.2 kg)   BMI 32.61 kg/m     Wt Readings from Last 3 Encounters:  08/19/20 190 lb (86.2 kg)  07/25/20 188 lb (85.3 kg)  07/16/20 186 lb (84.4 kg)     GEN:  Well nourished, well developed in no acute distress HEENT: Normal NECK: No JVD; No carotid bruits LYMPHATICS: No lymphadenopathy CARDIAC: RRR, no murmurs, rubs, gallops RESPIRATORY:  Clear to auscultation without rales, wheezing or rhonchi  ABDOMEN: Soft, non-tender, non-distended MUSCULOSKELETAL:  No edema; No deformity  SKIN: Warm and dry NEUROLOGIC:  Alert and oriented x 3 PSYCHIATRIC:  Normal affect   ASSESSMENT:    1. Chest pain of uncertain etiology   2. Essential hypertension, benign   3. Mixed hyperlipidemia   4. Diabetes mellitus with coincident hypertension (HCC)    PLAN:    In order of problems listed above:  Chest discomfort - We will check an echocardiogram to ensure proper structure and function. - Symptoms of stabbing-like intermittent chest discomfort likely musculoskeletal or perhaps even GERD related.  Sometimes she can feel this when drinking a soda- GERD/possible esophageal spasm like.  Encouraged water usage daily exercise stretching and control of her diabetes. - Left arm intermittent weakness or numbness does not sound cardiac, could be cervical radiculopathy.  Consider further work-up if necessary. -EKG is  reassuring.  J-point elevation noted but does not have typical pattern for pericarditis.  Change from prior EKG even in 2017. - Heart rate today is 91, mildly elevated.  Within normal range.  Essential hypertension - Continue with current therapy, on labetalol.  Per 2018, NP.  Diabetes with hypertension - Medications reviewed as above.  Per primary team.  Hypertriglyceridemia - Continue with Lovaza.  This will also improve once diabetes is improved as well.  LDL previously 51.      Medication Adjustments/Labs and Tests Ordered: Current medicines are reviewed at length with the patient today.  Concerns regarding medicines are outlined above.  Orders Placed This Encounter  Procedures  . EKG 12-Lead  . ECHOCARDIOGRAM COMPLETE   No orders  of the defined types were placed in this encounter.   Patient Instructions  Medication Instructions:  The current medical regimen is effective;  continue present plan and medications.  *If you need a refill on your cardiac medications before your next appointment, please call your pharmacy*  Testing/Procedures: Your physician has requested that you have an echocardiogram. Echocardiography is a painless test that uses sound waves to create images of your heart. It provides your doctor with information about the size and shape of your heart and how well your heart's chambers and valves are working. This procedure takes approximately one hour. There are no restrictions for this procedure.  Follow-Up: At [facility], you and your health needs are our priority.  As part of our continuing mission to provide you with exceptional heart care, we have created designated Provider Care Teams.  These Care Teams include your primary Cardiologist (physician) and Advanced Practice Providers (APPs -  Physician Assistants and Nurse Practitioners) who all work together to provide you with the care you need, when you need it.  We recommend signing up for the  patient portal called "MyChart".  Sign up information is provided on this After Visit Summary.  MyChart is used to connect with patients for Virtual Visits (Telemedicine).  Patients are able to view lab/test results, encounter notes, upcoming appointments, etc.  Non-urgent messages can be sent to your provider as well.   To learn more about what you can do with MyChart, go to [email or web address].    Your next appointment:   Further follow up will be based on the results of the above testing.  Thank you for choosing [facility]!!         Signed, Donato Schultz, MD  08/19/2020 12:10 PM    [facility]

## 2020-09-10 ENCOUNTER — Other Ambulatory Visit (HOSPITAL_COMMUNITY): Payer: Medicaid Other

## 2020-10-02 ENCOUNTER — Other Ambulatory Visit: Payer: Self-pay

## 2020-10-02 ENCOUNTER — Ambulatory Visit (HOSPITAL_COMMUNITY): Payer: Medicaid Other | Attending: Cardiology

## 2020-10-02 DIAGNOSIS — R079 Chest pain, unspecified: Secondary | ICD-10-CM | POA: Insufficient documentation

## 2020-10-02 DIAGNOSIS — I10 Essential (primary) hypertension: Secondary | ICD-10-CM | POA: Diagnosis not present

## 2020-10-02 LAB — ECHOCARDIOGRAM COMPLETE
Area-P 1/2: 4.31 cm2
S' Lateral: 2.4 cm

## 2020-10-14 ENCOUNTER — Other Ambulatory Visit (INDEPENDENT_AMBULATORY_CARE_PROVIDER_SITE_OTHER): Payer: Self-pay | Admitting: Internal Medicine

## 2020-10-14 ENCOUNTER — Telehealth (INDEPENDENT_AMBULATORY_CARE_PROVIDER_SITE_OTHER): Payer: Self-pay

## 2020-10-14 DIAGNOSIS — E1165 Type 2 diabetes mellitus with hyperglycemia: Secondary | ICD-10-CM

## 2020-10-14 NOTE — Telephone Encounter (Signed)
Okay, if she was discharged because she was noncompliant, she may have a tough time with any other endocrinologist.  The one that I like in [place] is Dr. Lafe Garin.

## 2020-10-14 NOTE — Telephone Encounter (Signed)
If you will put order in & I will refer to Dr Lafe Garin

## 2020-10-14 NOTE — Telephone Encounter (Signed)
Okay, I put the order in.  Thanks.

## 2020-10-14 NOTE — Telephone Encounter (Signed)
Why was she discharged from seeing Dr. Fransico Him?

## 2020-10-14 NOTE — Telephone Encounter (Signed)
REFERRAL SENT TO DR [facility] OFFICE.

## 2020-10-14 NOTE — Telephone Encounter (Signed)
She did not say to me. Looking in chart to see.

## 2020-10-23 ENCOUNTER — Ambulatory Visit (INDEPENDENT_AMBULATORY_CARE_PROVIDER_SITE_OTHER): Payer: Medicaid Other | Admitting: Nurse Practitioner

## 2020-11-13 ENCOUNTER — Ambulatory Visit (INDEPENDENT_AMBULATORY_CARE_PROVIDER_SITE_OTHER): Payer: Medicaid Other | Admitting: Nurse Practitioner

## 2020-11-13 ENCOUNTER — Encounter (INDEPENDENT_AMBULATORY_CARE_PROVIDER_SITE_OTHER): Payer: Self-pay | Admitting: Nurse Practitioner

## 2020-11-13 ENCOUNTER — Other Ambulatory Visit: Payer: Self-pay

## 2020-11-13 VITALS — BP 112/82 | HR 62 | Temp 97.2°F | Ht 64.0 in | Wt 185.4 lb

## 2020-11-13 DIAGNOSIS — K760 Fatty (change of) liver, not elsewhere classified: Secondary | ICD-10-CM

## 2020-11-13 DIAGNOSIS — E559 Vitamin D deficiency, unspecified: Secondary | ICD-10-CM

## 2020-11-13 DIAGNOSIS — E1165 Type 2 diabetes mellitus with hyperglycemia: Secondary | ICD-10-CM | POA: Diagnosis not present

## 2020-11-13 DIAGNOSIS — E785 Hyperlipidemia, unspecified: Secondary | ICD-10-CM

## 2020-11-13 DIAGNOSIS — I10 Essential (primary) hypertension: Secondary | ICD-10-CM | POA: Diagnosis not present

## 2020-11-13 DIAGNOSIS — F419 Anxiety disorder, unspecified: Secondary | ICD-10-CM | POA: Diagnosis not present

## 2020-11-13 MED ORDER — HYDROXYZINE HCL 50 MG PO TABS: 50.0000 mg | ORAL_TABLET | Freq: Three times a day (TID) | ORAL | 2 refills | Status: AC | PRN

## 2020-11-13 NOTE — Progress Notes (Signed)
Subjective:  Patient ID: Monica Jennings, female    DOB: 11-Jan-1996  Age: 24 y.o. MRN: [id number]  CC:  Chief Complaint  Patient presents with  . Follow-up    Doing okay, going to Endo on 11/28/2020  . Diabetes  . Other    Elevated liver enzymes, vitamin D deficiency  . Hypertension  . Hyperlipidemia      HPI  This patient arrives today for the above.  Anxiety: She continues to experience some anxiety but denies any suicidal ideation.  Tells me she misplaced her hydroxyzine and would like a refill on this if possible.  Diabetes: She has a history of diabetes last A1c was 11.3.  She is scheduled to see her endocrinologist later this month.  She also has a history of macroalbuminuria.  She continues on glipizide and Metformin.  She reports not checking her blood sugar on a regular basis but denies any polyuria, polydipsia, or polyphagia.   Elevated liver enzymes: Ultrasound of liver did show hepatic steatosis.  Vitamin D deficiency: She continues on 5000 IUs of vitamin D3 daily.  Last serum check showed a level of 17.  Hypertension: She continues on labetalol and is tolerating this well.  Hyperlipidemia: She has a history of hyperlipidemia with elevated triglycerides.  She continues on omega-3 supplement.  Past Medical History:  Diagnosis Date  . ADHD (attention deficit hyperactivity disorder)   . Diabetes (HCC)   . Hyperlipidemia   . Hypertension   . Migraines   . Vitamin D deficiency       Family History  Problem Relation Age of Onset  . ADD / ADHD Brother   . Diabetes Maternal Grandfather   . Hypertension Maternal Grandfather     Social History   Social History Narrative  . Not on file   Social History   Tobacco Use  . Smoking status: Never Smoker  . Smokeless tobacco: Never Used  Substance Use Topics  . Alcohol use: No     Current Meds  Medication Sig  . albuterol (VENTOLIN HFA) 108 (90 Base) MCG/ACT inhaler Inhale 1-2 puffs into the lungs  every 6 (six) hours as needed for wheezing or shortness of breath (cough).  . cholecalciferol (VITAMIN D3) 25 MCG (1000 UT) tablet Take 5,000 Units by mouth daily.  . folic acid (FOLVITE) 400 MCG tablet Take 400 mcg by mouth daily.  Marland Kitchen glipiZIDE (GLUCOTROL) 10 MG tablet Take 1 tablet (10 mg total) by mouth 2 (two) times daily before a meal.  . ibuprofen (ADVIL,MOTRIN) 400 MG tablet Take 1 tablet (400 mg total) by mouth every 6 (six) hours as needed.  . labetalol (NORMODYNE) 100 MG tablet Take 1 tablet (100 mg total) by mouth daily.  . metFORMIN (GLUCOPHAGE) 1000 MG tablet Take 1 tablet (1,000 mg total) by mouth 2 (two) times daily with a meal.  . omega-3 acid ethyl esters (LOVAZA) 1 g capsule Take 1,000 mg by mouth daily.  . [DISCONTINUED] hydrOXYzine (ATARAX/VISTARIL) 50 MG tablet Take 1 tablet (50 mg total) by mouth 3 (three) times daily as needed for anxiety.    ROS:  Review of Systems  Respiratory: Positive for cough. Negative for shortness of breath.   Cardiovascular: Negative for chest pain.  Neurological: Negative for dizziness and headaches.  Psychiatric/Behavioral: Positive for depression. Negative for suicidal ideas. The patient is nervous/anxious.      Objective:   Today's Vitals: BP 112/82   Pulse 62   Temp (!) 97.2 F (36.2 C) (  Temporal)   Ht 5\' 4"  (1.626 m)   Wt 185 lb 6.4 oz (84.1 kg)   SpO2 97%   BMI 31.82 kg/m  Vitals with BMI 11/13/2020 08/19/2020 07/25/2020  Height 5\' 4"  5\' 4"  5\' 4"   Weight 185 lbs 6 oz 190 lbs 188 lbs  BMI 31.81 32.6 32.25  Systolic 112 122 07/27/2020  Diastolic 82 90 88  Pulse 62 91 105     Physical Exam Vitals reviewed.  Constitutional:      General: She is not in acute distress.    Appearance: Normal appearance.  HENT:     Head: Normocephalic and atraumatic.  Neck:     Vascular: No carotid bruit.  Cardiovascular:     Rate and Rhythm: Normal rate and regular rhythm.     Pulses: Normal pulses.     Heart sounds: Normal heart sounds.   Pulmonary:     Effort: Pulmonary effort is normal.     Breath sounds: Normal breath sounds.  Skin:    General: Skin is warm and dry.  Neurological:     General: No focal deficit present.     Mental Status: She is alert and oriented to person, place, and time.  Psychiatric:        Mood and Affect: Mood normal.        Behavior: Behavior normal.        Judgment: Judgment normal.          Assessment and Plan   1. Uncontrolled type 2 diabetes mellitus with hyperglycemia (HCC)   2. Anxiety   3. Essential hypertension, benign   4. Vitamin D deficiency   5. Hyperlipidemia, unspecified hyperlipidemia type      Plan: 1.  She would like to have A1c collected today so is ready for her endocrinologist when she sees them later this month.  I will order that today, and will let endocrinology determine next step of action regarding treatment plan.  Patient is also willing to see nutritionist to discuss making some lifestyle changes aimed at helping to control her blood sugars.  I will order referral today. 2.  Hydroxyzine refilled today. 3.  To continue on her labetalol as her blood pressure is well controlled on this currently. 4.  We will check vitamin D level today for further evaluation. 5.  We will check lipid panel for further evaluation today.  Of note, she is interested in maybe trying to conceive another child in the near future.  Her goal is to first get her blood sugars under control.  I did tell her that she will need to discuss with her OB/GYN managing her medications as she probably will have to stop or change some prior to try to conceive.  She tells me she is not on any hormonal contraceptives but is actively trying to avoid pregnancy at this time..  Started today.  She tells me she understands that she should discuss with her OB/GYN medication adjustments prior to starting to try and conceive.   Tests ordered Orders Placed This Encounter  Procedures  . Hemoglobin A1c  .  Vitamin D, 25-hydroxy  . Microalbumin/Creatinine Ratio, Urine  . Lipid Panel  . Referral to Nutrition and Diabetes Services      Meds ordered this encounter  Medications  . hydrOXYzine (ATARAX/VISTARIL) 50 MG tablet    Sig: Take 1 tablet (50 mg total) by mouth 3 (three) times daily as needed for anxiety.    Dispense:  90 tablet  Refill:  2    Order Specific Question:   Supervising Provider    Answer:   Wilson Singer [[id number]]    Patient to follow-up in 3 months or sooner as needed.  Elenore Paddy, NP

## 2020-11-14 LAB — LIPID PANEL
Cholesterol: 134 mg/dL (ref <200)
HDL: 39 mg/dL — ABNORMAL LOW (ref >=50)
Non-HDL Cholesterol (Calc): 95 mg/dL (calc) (ref <130)
Total CHOL/HDL Ratio: 3.4 (calc) (ref <5.0)
Triglycerides: 528 mg/dL — ABNORMAL HIGH (ref <150)

## 2020-11-14 LAB — MICROALBUMIN / CREATININE URINE RATIO
Creatinine, Urine: 128 mg/dL (ref 20–275)
Microalb Creat Ratio: 128 mcg/mg creat — ABNORMAL HIGH (ref <30)
Microalb, Ur: 16.4 mg/dL

## 2020-11-14 LAB — VITAMIN D 25 HYDROXY (VIT D DEFICIENCY, FRACTURES): Vit D, 25-Hydroxy: 19 ng/mL — ABNORMAL LOW (ref 30–100)

## 2020-11-14 LAB — HEMOGLOBIN A1C
Hgb A1c MFr Bld: 11.6 % of total Hgb — ABNORMAL HIGH (ref <5.7)
Mean Plasma Glucose: 286 mg/dL
eAG (mmol/L): 15.9 mmol/L

## 2020-11-18 ENCOUNTER — Telehealth (INDEPENDENT_AMBULATORY_CARE_PROVIDER_SITE_OTHER): Payer: Self-pay

## 2020-11-18 ENCOUNTER — Telehealth: Payer: Self-pay | Admitting: *Deleted

## 2020-11-18 NOTE — Telephone Encounter (Signed)
Transition Care Management Follow-up Telephone Call  Date of discharge and from where: 11/17/2020 [facility] ED  How have you been since you were released from the hospital? "Sore today"  Any questions or concerns? No  Items Reviewed:  Did the pt receive and understand the discharge instructions provided? Yes   Medications obtained and verified? Yes   Other? No   Any new allergies since your discharge? No   Dietary orders reviewed? No  Do you have support at home? Yes     Functional Questionnaire: (I = Independent and D = Dependent) ADLs: I  Bathing/Dressing- I  Meal Prep- I  Eating- I  Maintaining continence- I  Transferring/Ambulation- I  Managing Meds- I  Follow up appointments reviewed:   PCP Hospital f/u appt confirmed? No  - has a routine appointment in July  Specialist Hospital f/u appt confirmed? No    Are transportation arrangements needed? No   If their condition worsens, is the pt aware to call PCP or go to the Emergency Dept.? Yes  Was the patient provided with contact information for the PCP's office or ED? Yes  Was to pt encouraged to call back with questions or concerns? Yes

## 2020-11-18 NOTE — Telephone Encounter (Signed)
Pain in head neck and down spine. They did a CT & x-ray at [facility]. She is a great deal of pain. They gave her muscle relaxer & potassium because it was low. Will be in on 11/20/20.

## 2020-11-20 ENCOUNTER — Encounter (INDEPENDENT_AMBULATORY_CARE_PROVIDER_SITE_OTHER): Payer: Self-pay | Admitting: Nurse Practitioner

## 2020-11-20 ENCOUNTER — Other Ambulatory Visit: Payer: Self-pay

## 2020-11-20 ENCOUNTER — Ambulatory Visit (INDEPENDENT_AMBULATORY_CARE_PROVIDER_SITE_OTHER): Payer: Medicaid Other | Admitting: Nurse Practitioner

## 2020-11-20 VITALS — BP 100/78 | HR 124 | Temp 96.8°F | Ht 64.0 in | Wt 185.4 lb

## 2020-11-20 DIAGNOSIS — M549 Dorsalgia, unspecified: Secondary | ICD-10-CM | POA: Diagnosis not present
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
## 2020-11-20 NOTE — Patient Instructions (Signed)
Concussion, Adult  A concussion is a brain injury from a hard, direct hit (trauma) to your head or body. This direct hit causes your brain to quickly shake back and forth inside your skull. A concussion may also be called a mild traumatic brain injury (TBI). Healing from this injury can take time. What are the causes? This condition is caused by:  A direct hit to your head, such as: ? Running into a player during a game. ? Being hit in a fight. ? Hitting your head on a hard surface.  A quick and sudden movement of the head or neck, such as in a car crash. What are the signs or symptoms? The signs of a concussion can be hard to notice. They may be missed by you, family members, and doctors. You may look fine on the outside but may not act or feel normal. Physical symptoms  Headaches.  Being dizzy.  Problems with body balance.  Being sensitive to light or noise.  Vomiting or feeling like you may vomit.  Being tired.  Problems seeing or hearing.  Not sleeping or eating as you used to.  Seizure. Mental and emotional symptoms  Feeling grouchy (irritable).  Having mood changes.  Problems remembering things.  Trouble focusing your mind (concentrating), organizing, or making decisions.  Being slow to think, act, react, speak, or read.  Feeling worried or nervous (anxious).  Feeling sad (depressed). How is this treated? This condition may be treated by:  Stopping sports or activity if you are injured. If you hit your head or have signs of concussion: ? Do not return to sports or activities the same day. ? Get checked by a doctor before you return to your activities.  Resting your body and your mind.  Being watched carefully, often at home.  Medicines to help with symptoms such as: ? Headaches. ? Feeling like you may vomit. ? Problems with sleep.  Avoiding alcohol and drugs.  Being asked to go to a concussion clinic or a place to help you recover  (rehabilitation center). Recovery from a concussion can take time. Return to activities only:  When you are fully healed.  When your doctor says it is safe. Avoid taking strong pain medicines (opioids) for a concussion. Follow these instructions at home: Activity  Limit activities that need a lot of thought or focus, such as: ? Homework or work for your job. ? Watching TV. ? Using the computer or phone. ? Playing memory games and puzzles.  Rest. Rest helps your brain heal. Make sure you: ? Get plenty of sleep. Most adults should get 7-9 hours of sleep each night. ? Rest during the day. Take naps or breaks when you feel tired.  Avoid activity like exercise until your doctor says its safe. Stop any activity that makes symptoms worse.  Do not do activities that could cause a second concussion, such as riding a bike or playing sports.  Ask your doctor when you can return to your normal activities, such as school, work, sports, and driving. Your ability to react may be slower. Do not do these activities if you are dizzy. General instructions  Take over-the-counter and prescription medicines only as told by your doctor.  Do not drink alcohol until your doctor says you can.  Watch your symptoms and tell other people to do the same. Other problems can occur after a concussion. Older adults have a higher risk of serious problems.  Tell your work manager, teachers, school nurse, school   counselor, coach, or athletic trainer about your injury and symptoms. Tell them about what you can or cannot do.  Keep all follow-up visits as told by your doctor. This is important.   How is this prevented? It is very important that you do not get another brain injury. In rare cases, another injury can cause brain damage that will not go away, brain swelling, or death. The risk of this is greatest in the first 7-10 days after a head injury. To avoid injuries:  Stop activities that could lead to a second  concussion, such as contact sports, until your doctor says it is okay.  When you return to sports or activities: ? Do not crash into other players. This is how most concussions happen. ? Follow the rules. ? Respect other players. Do not engage in violent behavior while playing.  Get regular exercise. Do strength and balance training.  Wear a helmet that fits you well during sports, biking, or other activities.  Helmets can help protect you from serious skull and brain injuries, but they do not protect you from a concussion. Even when wearing a helmet, you should avoid being hit in the head. Contact a doctor if:  Your symptoms do not get better.  You have new symptoms.  You have another injury. Get help right away if:  You have bad headaches or your headaches get worse.  You feel weak or numb in any part of your body.  You feel mixed up (confused).  Your balance gets worse.  You vomit often.  You feel more sleepy than normal.  You cannot speak well, or have slurred speech.  You have a seizure.  Others have trouble waking you up.  You have changes in how you act.  You have changes in how you see (vision).  You pass out (lose consciousness). These symptoms may be an emergency. Do not wait to see if the symptoms will go away. Get medical help right away. Call your local emergency services (911 in the [place] Do not drive yourself to the hospital. Summary  A concussion is a brain injury from a hard, direct hit (trauma) to your head or body.  This condition is treated with rest and careful watching of symptoms.  Ask your doctor when you can return to your normal activities, such as school, work, or driving.  Get help right away if you have a very bad headache, feel weak in any part of your body, have a seizure, have changes in how you act or see, or if you are mixed up or more sleepy than normal. This information is not intended to replace advice given to you by your  health care provider. Make sure you discuss any questions you have with your health care provider. Document Revised: 06/08/2019 Document Reviewed: 06/08/2019 Elsevier Patient Education  2021 [employer].  

## 2020-11-27 ENCOUNTER — Telehealth (INDEPENDENT_AMBULATORY_CARE_PROVIDER_SITE_OTHER): Payer: Self-pay

## 2020-11-27 ENCOUNTER — Telehealth: Payer: Self-pay

## 2020-11-27 NOTE — Telephone Encounter (Signed)
Transition Care Management Follow-up Telephone Call  Date of discharge and from where: 11/26/2020 from [facility]  How have you been since you were released from the hospital? Pt states that she is feeling well today and has no questions or concerns at this time.   Any questions or concerns? No  Items Reviewed:  Did the pt receive and understand the discharge instructions provided? Yes   Medications obtained and verified? Yes   Other? No   Any new allergies since your discharge? No   Dietary orders reviewed? DM  Do you have support at home? Yes   Functional Questionnaire: (I = Independent and D = Dependent) ADLs: I  Bathing/Dressing- I  Meal Prep- I  Eating- I  Maintaining continence- I  Transferring/Ambulation- I  Managing Meds- I   Follow up appointments reviewed:   PCP Hospital f/u appt confirmed? No    Specialist Hospital f/u appt confirmed? Yes  Scheduled to see Desiree Hane, MD on 11/28/2020 @ 08:20am.  Are transportation arrangements needed? No   If their condition worsens, is the pt aware to call PCP or go to the Emergency Dept.? Yes  Was the patient provided with contact information for the PCP's office or ED? Yes  Was to pt encouraged to call back with questions or concerns? Yes

## 2020-11-27 NOTE — Telephone Encounter (Signed)
Transition Care Management Follow-up Telephone Call  Date of discharge and from where:[facility] d/c to home.   How have you been since you were released from the hospital?ok  Any questions or concerns? Unspecified abdominal pain; was given dilaudid for pain; test came back normal & they was letting her go home.  Items Reviewed:  Did the pt receive and understand the discharge instructions provided? Yes, instructions.  Medications obtained and verified? NO, she feels like it is constipation.   Other? No  Any new allergies since your discharge? NONE   Dietary orders reviewed? none Do you have support at home?Yes, boyfriend. Home Care and Equipment/Supplies: Were home health services ordered? NO  If so, what is the name of the agency? YES Has the agency set up a time to come to the patient's home? N/a  Were any new equipment or medical supplies ordered?  NO What is the name of the medical supply agency?n/a  Were you able to get the supplies/equipment? No  Do you have any questions related to the use of the equipment or supplies? NO  Functional Questionnaire: (I = Independent and D = Dependent) ADLs:I  Bathing/Dressing: I  Meal Prep-I  Eating- I  Maintaining continence- I  Transferring/Ambulation-I  Managing Meds-I Follow up appointments reviewed:   PCP Hospital f/u appt confirmed? YES,  [facility] dr [facility] Scheduled to see DR. GHEREGHE on 11/28/20@8 :20 AM. & Dr 11/30/20 -New Diabetic appt   On 5/13 @ 1:30pm  Are transportation arrangements needed? No  If their condition worsens, is the pt aware to call PCP or go to the Emergency Dept.?YES   Was the patient provided with contact information for the PCP's office or ED?YES, [facility]   Was to pt encouraged to call back with questions or concerns? YES

## 2020-11-28 ENCOUNTER — Encounter: Payer: Self-pay | Admitting: Internal Medicine

## 2020-11-28 ENCOUNTER — Telehealth (INDEPENDENT_AMBULATORY_CARE_PROVIDER_SITE_OTHER): Payer: Self-pay

## 2020-11-28 ENCOUNTER — Other Ambulatory Visit: Payer: Self-pay

## 2020-11-28 ENCOUNTER — Encounter (HOSPITAL_COMMUNITY): Payer: Self-pay | Admitting: *Deleted

## 2020-11-28 ENCOUNTER — Emergency Department (HOSPITAL_COMMUNITY)
Admission: EM | Admit: 2020-11-28 | Discharge: 2020-11-28 | Disposition: A | Discharge status: Home / Self Care | Payer: Medicaid Other | Attending: Emergency Medicine | Admitting: Emergency Medicine

## 2020-11-28 ENCOUNTER — Ambulatory Visit: Payer: Medicaid Other | Admitting: Internal Medicine

## 2020-11-28 DIAGNOSIS — Z7984 Long term (current) use of oral hypoglycemic drugs: Secondary | ICD-10-CM | POA: Diagnosis not present

## 2020-11-28 DIAGNOSIS — E1129 Type 2 diabetes mellitus with other diabetic kidney complication: Secondary | ICD-10-CM

## 2020-11-28 DIAGNOSIS — R112 Nausea with vomiting, unspecified: Secondary | ICD-10-CM | POA: Insufficient documentation

## 2020-11-28 DIAGNOSIS — I10 Essential (primary) hypertension: Secondary | ICD-10-CM | POA: Diagnosis not present

## 2020-11-28 DIAGNOSIS — R1013 Epigastric pain: Secondary | ICD-10-CM | POA: Insufficient documentation

## 2020-11-28 DIAGNOSIS — Z79899 Other long term (current) drug therapy: Secondary | ICD-10-CM | POA: Diagnosis not present

## 2020-11-28 DIAGNOSIS — E119 Type 2 diabetes mellitus without complications: Secondary | ICD-10-CM | POA: Diagnosis not present

## 2020-11-28 DIAGNOSIS — R000 Tachycardia, unspecified: Secondary | ICD-10-CM | POA: Insufficient documentation

## 2020-11-28 DIAGNOSIS — Z794 Long term (current) use of insulin: Secondary | ICD-10-CM | POA: Diagnosis not present

## 2020-11-28 DIAGNOSIS — Z91040 Latex allergy status: Secondary | ICD-10-CM | POA: Insufficient documentation

## 2020-11-28 LAB — COMPREHENSIVE METABOLIC PANEL
ALT: 175 U/L — ABNORMAL HIGH (ref 0–44)
AST: 111 U/L — ABNORMAL HIGH (ref 15–41)
Albumin: 3.9 g/dL (ref 3.5–5.0)
Alkaline Phosphatase: 69 U/L (ref 38–126)
Anion gap: 12 (ref 5–15)
BUN: 17 mg/dL (ref 6–20)
CO2: 24 mmol/L (ref 22–32)
Calcium: 8.7 mg/dL — ABNORMAL LOW (ref 8.9–10.3)
Chloride: 95 mmol/L — ABNORMAL LOW (ref 98–111)
Creatinine, Ser: 0.73 mg/dL (ref 0.44–1.00)
GFR, Estimated: >60 mL/min (ref >60)
Glucose, Bld: 318 mg/dL — ABNORMAL HIGH (ref 70–99)
Potassium: 4.1 mmol/L (ref 3.5–5.1)
Sodium: 131 mmol/L — ABNORMAL LOW (ref 135–145)
Total Bilirubin: 0.7 mg/dL (ref 0.3–1.2)
Total Protein: 8.1 g/dL (ref 6.5–8.1)

## 2020-11-28 LAB — URINALYSIS, ROUTINE W REFLEX MICROSCOPIC
Bilirubin Urine: NEGATIVE
Glucose, UA: >=500 mg/dL — AB
Hgb urine dipstick: NEGATIVE
Ketones, ur: 20 mg/dL — AB
Nitrite: NEGATIVE
Protein, ur: 100 mg/dL — AB
Specific Gravity, Urine: 1.033 — ABNORMAL HIGH (ref 1.005–1.030)
pH: 5 (ref 5.0–8.0)

## 2020-11-28 LAB — CBC WITH DIFFERENTIAL/PLATELET
Abs Immature Granulocytes: 0.04 10*3/uL (ref 0.00–0.07)
Basophils Absolute: 0.1 10*3/uL (ref 0.0–0.1)
Basophils Relative: 1 %
Eosinophils Absolute: 0 10*3/uL (ref 0.0–0.5)
Eosinophils Relative: 0 %
HCT: 44.6 % (ref 36.0–46.0)
Hemoglobin: 15.2 g/dL — ABNORMAL HIGH (ref 12.0–15.0)
Immature Granulocytes: 0 %
Lymphocytes Relative: 8 %
Lymphs Abs: 0.8 10*3/uL (ref 0.7–4.0)
MCH: 30.6 pg (ref 26.0–34.0)
MCHC: 34.1 g/dL (ref 30.0–36.0)
MCV: 89.7 fL (ref 80.0–100.0)
Monocytes Absolute: 0.5 10*3/uL (ref 0.1–1.0)
Monocytes Relative: 5 %
Neutro Abs: 8.4 10*3/uL — ABNORMAL HIGH (ref 1.7–7.7)
Neutrophils Relative %: 86 %
Platelets: 249 10*3/uL (ref 150–400)
RBC: 4.97 MIL/uL (ref 3.87–5.11)
RDW: 12.5 % (ref 11.5–15.5)
WBC: 9.8 10*3/uL (ref 4.0–10.5)
nRBC: 0 % (ref 0.0–0.2)

## 2020-11-28 LAB — POCT GLUCOSE (DEVICE FOR HOME USE): Glucose Fasting, POC: 368 mg/dL — AB (ref 70–99)

## 2020-11-28 LAB — LIPASE, BLOOD: Lipase: 23 U/L (ref 11–51)

## 2020-11-28 LAB — PREGNANCY, URINE: Preg Test, Ur: NEGATIVE

## 2020-11-28 LAB — CBG MONITORING, ED: Glucose-Capillary: 319 mg/dL — ABNORMAL HIGH (ref 70–99)

## 2020-11-28 MED ORDER — DEXCOM G6 SENSOR MISC: 1.0000 | 3 refills | Status: AC

## 2020-11-28 MED ORDER — DEXCOM G6 RECEIVER DEVI: 1.0000 | Freq: Once | 0 refills | Status: DC

## 2020-11-28 MED ORDER — MORPHINE SULFATE (PF) 4 MG/ML IV SOLN
4.0000 mg | Freq: Once | INTRAVENOUS | Status: AC
Start: 2020-11-28 — End: 2020-11-28
  Administered 2020-11-28: 4 mg via INTRAVENOUS
  Filled 2020-11-28: qty 1

## 2020-11-28 MED ORDER — GLIPIZIDE 10 MG PO TABS: 10.0000 mg | ORAL_TABLET | Freq: Two times a day (BID) | ORAL | 1 refills | Status: DC

## 2020-11-28 MED ORDER — ALUM & MAG HYDROXIDE-SIMETH 200-200-20 MG/5ML PO SUSP
30.0000 mL | Freq: Once | ORAL | Status: AC
  Administered 2020-11-28: 30 mL via ORAL
  Filled 2020-11-28: qty 30

## 2020-11-28 MED ORDER — LANTUS SOLOSTAR 100 UNIT/ML ~~LOC~~ SOPN: 12.0000 [IU] | PEN_INJECTOR | Freq: Every day | SUBCUTANEOUS | 11 refills | Status: DC

## 2020-11-28 MED ORDER — METOCLOPRAMIDE HCL 5 MG/ML IJ SOLN
10.0000 mg | Freq: Once | INTRAMUSCULAR | Status: AC
  Administered 2020-11-28: 10 mg via INTRAVENOUS
  Filled 2020-11-28: qty 2

## 2020-11-28 MED ORDER — METOCLOPRAMIDE HCL 10 MG PO TABS: 10.0000 mg | ORAL_TABLET | Freq: Four times a day (QID) | ORAL | 0 refills | Status: AC | PRN

## 2020-11-28 MED ORDER — KETOROLAC TROMETHAMINE 30 MG/ML IJ SOLN
30.0000 mg | Freq: Once | INTRAMUSCULAR | Status: AC
  Administered 2020-11-28: 30 mg via INTRAVENOUS
  Filled 2020-11-28: qty 1

## 2020-11-28 MED ORDER — FAMOTIDINE IN NACL 20-0.9 MG/50ML-% IV SOLN
20.0000 mg | Freq: Once | INTRAVENOUS | Status: AC
  Administered 2020-11-28: 20 mg via INTRAVENOUS
  Filled 2020-11-28: qty 50

## 2020-11-28 MED ORDER — DEXCOM G6 TRANSMITTER MISC: 1.0000 | 3 refills | Status: AC

## 2020-11-28 MED ORDER — LIDOCAINE VISCOUS HCL 2 % MT SOLN
15.0000 mL | Freq: Once | OROMUCOSAL | Status: AC
  Administered 2020-11-28: 15 mL via ORAL
  Filled 2020-11-28: qty 15

## 2020-11-28 MED ORDER — INSULIN PEN NEEDLE 32G X 4 MM MISC: 3 refills | Status: DC

## 2020-11-28 MED ORDER — SODIUM CHLORIDE 0.9 % IV BOLUS
1000.0000 mL | Freq: Once | INTRAVENOUS | Status: AC
  Administered 2020-11-28: 1000 mL via INTRAVENOUS

## 2020-11-28 MED ORDER — METFORMIN HCL 1000 MG PO TABS: 1000.0000 mg | ORAL_TABLET | Freq: Two times a day (BID) | ORAL | 1 refills | Status: DC

## 2020-11-28 NOTE — ED Provider Notes (Signed)
[facility] EMERGENCY DEPARTMENT Provider Note   CSN: [id number] Arrival date & time: 11/28/20  1245     History Chief Complaint  Patient presents with  . Abdominal Pain    Monica Jennings is a 25 y.o. female.  Monica Jennings is a 25 y.o. female with history of hypertension, hyperlipidemia, poorly controlled diabetes, migraines, hepatic steatosis, ADHD, who presents to the emergency department via EMS for evaluation of epigastric abdominal pain, nausea and tachycardia.  Patient reports she has been having intermittent issues with abdominal pain throughout the week.  Pain initially started on Sunday, seem to worsen and she ended up going to [facility] on 4/19 for evaluation of abdominal pain, at this time pain was primarily in the right lower quadrant, she had lab work significant for hyperglycemia, but reassuring CT scan, symptoms improved with pain management in the ED and she was discharged home, she seemed to be doing better until pain started to return yesterday evening.  It was manageable at home, but this time pain was primarily located in the epigastric region.  It waxes and wanes but when it comes severe it seems to radiate throughout the abdomen.  She had an appointment with her endocrinologist today to discuss better management of her diabetes, because her sugars typically range in the 200-400s most often.  She was started on insulin today.  She reports her epigastric pain worsened today, she was having dry heaving and feeling extremely nauseous but unable to vomit.  Also reports she is struggled with constipation and has felt like she needed to have a large bowel movement but has only been able to pass a small amount of stool despite straining.  No blood in the stool.  No medications for this abdominal pain prior to arrival.  Patient noted to be tachycardic as well, history of tachycardia.  No chest pain or shortness of breath.  No dysuria, vaginal bleeding or discharge.         Past Medical History:  Diagnosis Date  . ADHD (attention deficit hyperactivity disorder)   . Diabetes (HCC)   . Hyperlipidemia   . Hypertension   . Migraines   . Vitamin D deficiency     Patient Active Problem List   Diagnosis Date Noted  . Hepatic steatosis 11/13/2020  . Uncontrolled type 2 diabetes mellitus with hyperglycemia (HCC) 10/17/2019  . Heartburn 07/11/2019  . Abdominal pain 07/11/2019  . Tachycardia 07/11/2019  . Encounter for general adult medical examination with abnormal findings 07/11/2019  . Needs flu shot 06/05/2019  . Migraine 06/05/2019  . Cough 06/05/2019  . Diabetes (HCC)   . Vitamin D deficiency   . Essential hypertension, benign   . Mixed hyperlipidemia   . Constipation 10/25/2018    Past Surgical History:  Procedure Laterality Date  . NO PAST SURGERIES       OB History   No obstetric history on file.     Family History  Problem Relation Age of Onset  . ADD / ADHD Brother   . Diabetes Maternal Grandfather   . Hypertension Maternal Grandfather     Social History   Tobacco Use  . Smoking status: Never Smoker  . Smokeless tobacco: Never Used  Vaping Use  . Vaping Use: Former  Substance Use Topics  . Alcohol use: No  . Drug use: No    Home Medications Prior to Admission medications   Medication Sig Start Date End Date Taking? Authorizing Provider  albuterol (VENTOLIN HFA) 108 (90  Base) MCG/ACT inhaler Inhale 1-2 puffs into the lungs every 6 (six) hours as needed for wheezing or shortness of breath (cough). 06/05/19  Yes Elenore PaddyGray, Sarah E, NP  cholecalciferol (VITAMIN D3) 25 MCG (1000 UT) tablet Take 5,000 Units by mouth daily.   Yes [provider]  famotidine (PEPCID) 10 MG tablet Take 10 mg by mouth 2 (two) times daily.   Yes [provider]  folic acid (FOLVITE) 400 MCG tablet Take 400 mcg by mouth daily.   Yes [provider]  hydrOXYzine (ATARAX/VISTARIL) 50 MG tablet Take 1 tablet (50 mg total) by  mouth 3 (three) times daily as needed for anxiety. 11/13/20  Yes Elenore PaddyGray, Sarah E, NP  IBU 800 MG tablet Take 800 mg by mouth every 8 (eight) hours. 11/18/20  Yes [provider]  ibuprofen (ADVIL,MOTRIN) 400 MG tablet Take 1 tablet (400 mg total) by mouth every 6 (six) hours as needed. 05/11/16  Yes Eber HongMiller, Brian, MD  labetalol (NORMODYNE) 100 MG tablet Take 1 tablet (100 mg total) by mouth daily. 01/02/20  Yes Nida, Denman GeorgeGebreselassie W, MD  Continuous Blood Gluc Sensor (DEXCOM G6 SENSOR) MISC Inject 1 Device into the skin continuous for 10 days. 11/28/20 12/08/20  Carlus PavlovGherghe, Cristina, MD  Continuous Blood Gluc Transmit (DEXCOM G6 TRANSMITTER) MISC 1 Device by Does not apply route every 3 (three) months. 11/28/20   Carlus PavlovGherghe, Cristina, MD  glipiZIDE (GLUCOTROL) 10 MG tablet Take 1 tablet (10 mg total) by mouth 2 (two) times daily before a meal. Patient not taking: Reported on 11/28/2020 11/28/20   Carlus PavlovGherghe, Cristina, MD  insulin glargine (LANTUS SOLOSTAR) 100 UNIT/ML Solostar Pen Inject 12 Units into the skin daily. Patient not taking: Reported on 11/28/2020 11/28/20   Carlus PavlovGherghe, Cristina, MD  Insulin Pen Needle 32G X 4 MM MISC Use 1x a day 11/28/20   Carlus PavlovGherghe, Cristina, MD  metFORMIN (GLUCOPHAGE) 1000 MG tablet Take 1 tablet (1,000 mg total) by mouth 2 (two) times daily with a meal. Patient not taking: No sig reported 11/28/20   Carlus PavlovGherghe, Cristina, MD  omega-3 acid ethyl esters (LOVAZA) 1 g capsule Take 1,000 mg by mouth daily. Patient not taking: No sig reported    [provider]    Allergies    Augmentin [amoxicillin-pot clavulanate], Sulfa antibiotics, Sulfamethoxazole, Latex, and Penicillins  Review of Systems   Review of Systems  Constitutional: Negative for chills and fever.  HENT: Negative.   Respiratory: Negative for cough and shortness of breath.   Cardiovascular: Negative for chest pain.       Tachycardia  Gastrointestinal: Positive for abdominal pain, constipation, nausea and vomiting.  Negative for diarrhea.  Genitourinary: Negative for dysuria, frequency, pelvic pain, vaginal bleeding and vaginal discharge.  Musculoskeletal: Positive for back pain. Negative for arthralgias and myalgias.  Skin: Negative for color change and rash.  Neurological: Negative for headaches.  All other systems reviewed and are negative.   Physical Exam Updated Vital Signs BP (!) 130/91 (BP Location: Right Arm)   Pulse (!) 132   Temp 99.4 F (37.4 C) (Oral)   Resp 20   Ht 5\' 4"  (1.626 m)   Wt 83.5 kg   LMP 11/10/2020   SpO2 98%   BMI 31.58 kg/m   Physical Exam Vitals and nursing note reviewed.  Constitutional:      General: She is not in acute distress.    Appearance: She is well-developed. She is obese. She is not ill-appearing or diaphoretic.     Comments: Patient appears uncomfortable,  but is in no acute distress  HENT:     Head: Normocephalic and atraumatic.  Eyes:     General:        Right eye: No discharge.        Left eye: No discharge.     Pupils: Pupils are equal, round, and reactive to light.  Cardiovascular:     Rate and Rhythm: Regular rhythm. Tachycardia present.     Heart sounds: Normal heart sounds.     Comments: Tachycardia with regular rhythm Pulmonary:     Effort: Pulmonary effort is normal. No respiratory distress.     Breath sounds: Normal breath sounds. No wheezing or rales.     Comments: Respirations equal and unlabored, patient able to speak in full sentences, lungs clear to auscultation bilaterally  Abdominal:     General: Bowel sounds are normal. There is no distension.     Palpations: Abdomen is soft. There is no mass.     Tenderness: There is generalized abdominal tenderness and tenderness in the epigastric area. There is no right CVA tenderness, left CVA tenderness or guarding.     Comments: Abdomen is soft and nondistended, bowel sounds present throughout, there is mild generalized tenderness noted throughout the abdomen, most severe in the  epigastric region.  With distraction patient has no guarding or peritoneal signs.  Musculoskeletal:        General: No deformity.     Cervical back: Neck supple.  Skin:    General: Skin is warm and dry.     Capillary Refill: Capillary refill takes less than 2 seconds.  Neurological:     Mental Status: She is alert.     Coordination: Coordination normal.     Comments: Speech is clear, able to follow commands Moves extremities without ataxia, coordination intact  Psychiatric:        Mood and Affect: Mood normal.        Behavior: Behavior normal.     ED Results / Procedures / Treatments   Labs (all labs ordered are listed, but only abnormal results are displayed) Labs Reviewed  URINALYSIS, ROUTINE W REFLEX MICROSCOPIC - Abnormal; Notable for the following components:      Result Value   APPearance HAZY (*)    Specific Gravity, Urine 1.033 (*)    Glucose, UA >=500 (*)    Ketones, ur 20 (*)    Protein, ur 100 (*)    Leukocytes,Ua TRACE (*)    Bacteria, UA FEW (*)    All other components within normal limits  COMPREHENSIVE METABOLIC PANEL - Abnormal; Notable for the following components:   Sodium 131 (*)    Chloride 95 (*)    Glucose, Bld 318 (*)    Calcium 8.7 (*)    AST 111 (*)    ALT 175 (*)    All other components within normal limits  CBC WITH DIFFERENTIAL/PLATELET - Abnormal; Notable for the following components:   Hemoglobin 15.2 (*)    Neutro Abs 8.4 (*)    All other components within normal limits  URINE CULTURE  PREGNANCY, URINE  LIPASE, BLOOD  CBG MONITORING, ED    EKG None  Radiology No results found.  Procedures Procedures   Medications Ordered in ED Medications  sodium chloride 0.9 % bolus 1,000 mL (0 mLs Intravenous Stopped 11/28/20 1505)  metoCLOPramide (REGLAN) injection 10 mg (10 mg Intravenous Given 11/28/20 1405)  famotidine (PEPCID) IVPB 20 mg premix (0 mg Intravenous Stopped 11/28/20 1505)  morphine 4 MG/ML  injection 4 mg (4 mg Intravenous  Given 11/28/20 1405)  alum & mag hydroxide-simeth (MAALOX/MYLANTA) 200-200-20 MG/5ML suspension 30 mL (30 mLs Oral Given 11/28/20 1405)    And  lidocaine (XYLOCAINE) 2 % viscous mouth solution 15 mL (15 mLs Oral Given 11/28/20 1406)    ED Course  I have reviewed the triage vital signs and the nursing notes.  Pertinent labs & imaging results that were available during my care of the patient were reviewed by me and considered in my medical decision making (see chart for details).    MDM Rules/Calculators/A&P                         Patient presents to the ED with complaints of abdominal pain. Patient nontoxic appearing, in no apparent distress, patient afebrile, noted to be tachycardic on arrival with heart rate in the 130s, does have known history of tachycardia and has been tachycardic into the 120s on prior evaluations. On exam patient tender to tender to palpation throughout, most notably in the epigastric region, no peritoneal signs. Will evaluate with labs, patient just had reassuring abdominal CT on Tuesday, high clinical suspicion for gastroparesis as cause of pain today, will hold off on additional imaging at this time. Analgesics, anti-emetics, and fluids administered.   Additional history obtained:  Additional history obtained from chart review & nursing note review.   Lab Tests:  I Ordered, reviewed, and interpreted labs, which included:  CBC: No leukocytosis, slightly elevated hemoglobin, likely hemoconcentration CMP: Sodium of 131, likely in the setting of glucose of 318, mild hypocalcemia, no other electrolyte derangements, normal renal function, AST of 111 and ALT of 117 but normal bili, this is actually improved from lab work recently done at [facility], likely in the setting of hepatic steatosis Lipase: WNL UA: Glucosuria noted, few bacteria and trace leukocytes but no clear signs of infection, and patient not having urinary symptoms.  Yeast present likely in the setting of  chronic hyperglycemia Preg test: Negative   ED Course:    RE-EVAL: After pain medication, antiemetics, GI cocktail and fluids patient is feeling much better, tachycardia is improving and she is now tolerating p.o. fluids  On repeat abdominal exam patient remains without peritoneal signs, low suspicion for cholecystitis, pancreatitis, diverticulitis, appendicitis, bowel obstruction/perforation,  PID, ectopic pregnancy, or other acute surgical process. Patient tolerating PO in the emergency department. Will discharge home with supportive measures.  Suspect gastroparesis.  Had a long discussion with patient about importance of diabetes control and improving symptoms.  Will discharge with Reglan.  I have asked patient to stick to clear liquids for the next few hours and if this is going well she can slowly increase her diet to small amounts of bland foods, but cautioned her to go too quickly as this would likely lead to recurrent vomiting and pain.  I discussed results, treatment plan, need for PCP follow-up, and return precautions with the patient. Provided opportunity for questions, patient confirmed understanding and is in agreement with plan.    Portions of this note were generated with Scientist, clinical (histocompatibility and immunogenetics). Dictation errors may occur despite best attempts at proofreading.   Final Clinical Impression(s) / ED Diagnoses Final diagnoses:  Epigastric pain  Non-intractable vomiting with nausea, unspecified vomiting type    Rx / DC Orders ED Discharge Orders         Ordered    metoCLOPramide (REGLAN) 10 MG tablet  Every 6 hours PRN  11/28/20 1617           Dartha Lodge, PA-C 11/29/20 0006    Mancel Bale, MD 11/29/20 [phone]
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
## 2020-11-28 NOTE — Discharge Instructions (Addendum)
Your evaluation today is reassuring, I suspect a lot of your symptoms are due to gastroparesis which can affect the function of your stomach due to uncontrolled diabetes.  Continuing to work on getting your blood sugars under control will help significantly.  For the next several hours please stick with a clear liquid diet and then if this is going well you can move on to bland foods in small amounts.  If you try and progress too quickly and drink a large amount of liquids it once or try and eat a full meal your vomiting and pain will likely quickly return.  Use Reglan as needed for nausea and abdominal pain.  Use over-the-counter Pepcid twice daily before breakfast and dinner.  Use MiraLAX once daily to help regulate bowels and improve constipation.  Follow-up with your primary care doctor, endocrinologist and I would also like for you to follow-up with GI.

## 2020-11-28 NOTE — ED Notes (Signed)
Pt given some water.  Will bring some crackers per request also

## 2020-11-28 NOTE — ED Triage Notes (Signed)
Abdominal pain with nausea onset yesterday 

## 2020-11-28 NOTE — ED Notes (Signed)
Call to lab as urine for pt was previously dropped off.  They will run it

## 2020-11-28 NOTE — Patient Instructions (Addendum)
Please continue: - Metformin 1000 mg 2x a day, with meals - Glipizide 10 mg 2x a day before meals  Please add: - Lantus 12 units at bedtime Increase by 4 units every 2 days until sugars in am <150 or if you reach 36 units.  STOP sweet drinks!  Please let me know if the sugars are consistently <80 or >200.  Please return in 1.5 months with your sugar log.   PATIENT INSTRUCTIONS FOR TYPE 2 DIABETES:  DIET AND EXERCISE Diet and exercise is an important part of diabetic treatment.  We recommended aerobic exercise in the form of brisk walking (working between 40-60% of maximal aerobic capacity, similar to brisk walking) for 150 minutes per week (such as 30 minutes five days per week) along with 3 times per week performing 'resistance' training (using various gauge rubber tubes with handles) 5-10 exercises involving the major muscle groups (upper body, lower body and core) performing 10-15 repetitions (or near fatigue) each exercise. Start at half the above goal but build slowly to reach the above goals. If limited by weight, joint pain, or disability, we recommend daily walking in a swimming pool with water up to waist to reduce pressure from joints while allow for adequate exercise.    BLOOD GLUCOSES Monitoring your blood glucoses is important for continued management of your diabetes. Please check your blood glucoses 2-4 times a day: fasting, before meals and at bedtime (you can rotate these measurements - e.g. one day check before the 3 meals, the next day check before 2 of the meals and before bedtime, etc.).   HYPOGLYCEMIA (low blood sugar) Hypoglycemia is usually a reaction to not eating, exercising, or taking too much insulin/ other diabetes drugs.  Symptoms include tremors, sweating, hunger, confusion, headache, etc. Treat IMMEDIATELY with 15 grams of Carbs: . 4 glucose tablets .  cup regular juice/soda . 2 tablespoons raisins . 4 teaspoons sugar . 1 tablespoon honey Recheck blood  glucose in 15 mins and repeat above if still symptomatic/blood glucose <100.  RECOMMENDATIONS TO REDUCE YOUR RISK OF DIABETIC COMPLICATIONS: * Take your prescribed MEDICATION(S) * Follow a DIABETIC diet: Complex carbs, fiber rich foods, (monounsaturated and polyunsaturated) fats * AVOID saturated/trans fats, high fat foods, >2,300 mg salt per day. * EXERCISE at least 5 times a week for 30 minutes or preferably daily.  * DO NOT SMOKE OR DRINK more than 1 drink a day. * Check your FEET every day. Do not wear tightfitting shoes. Contact us if you develop an ulcer * See your EYE doctor once a year or more if needed * Get a FLU shot once a year * Get a PNEUMONIA vaccine once before and once after age 67 years  GOALS:  * Your Hemoglobin A1c of <7%  * fasting sugars need to be <130 * after meals sugars need to be <180 (2h after you start eating) * Your Systolic BP should be 140 or lower  * Your Diastolic BP should be 80 or lower  * Your HDL (Good Cholesterol) should be 40 or higher  * Your LDL (Bad Cholesterol) should be 100 or lower. * Your Triglycerides should be 150 or lower  * Your Urine microalbumin (kidney function) should be <30 * Your Body Mass Index should be 25 or lower    Please consider the following ways to cut down carbs and fat and increase fiber and micronutrients in your diet: - substitute whole grain for white bread or pasta - substitute brown rice for  white rice - substitute 90-calorie flat bread pieces for slices of bread when possible - substitute sweet potatoes or yams for white potatoes - substitute humus for margarine - substitute tofu for cheese when possible - substitute almond or rice milk for regular milk (would not drink soy milk daily due to concern for soy estrogen influence on breast cancer risk) - substitute dark chocolate for other sweets when possible - substitute water - can add lemon or orange slices for taste - for diet sodas (artificial sweeteners  will trick your body that you can eat sweets without getting calories and will lead you to overeating and weight gain in the long run) - do not skip breakfast or other meals (this will slow down the metabolism and will result in more weight gain over time)  - can try smoothies made from fruit and almond/rice milk in am instead of regular breakfast - can also try old-fashioned (not instant) oatmeal made with almond/rice milk in am - order the dressing on the side when eating salad at a restaurant (pour less than half of the dressing on the salad) - eat as little meat as possible - can try juicing, but should not forget that juicing will get rid of the fiber, so would alternate with eating raw veg./fruits or drinking smoothies - use as little oil as possible, even when using olive oil - can dress a salad with a mix of balsamic vinegar and lemon juice, for e.g. - use agave nectar, stevia sugar, or regular sugar rather than artificial sweateners - steam or broil/roast veggies  - snack on veggies/fruit/nuts (unsalted, preferably) when possible, rather than processed foods - reduce or eliminate aspartame in diet (it is in diet sodas, chewing gum, etc) Read the labels!  Try to read Dr. Katherina Right book: "Program for Reversing Diabetes" for other ideas for healthy eating.

## 2020-11-28 NOTE — Telephone Encounter (Signed)
Patients mom called and stated that the patient is in severe pain in her abdomen and back and she has not had a bowel movement since last week and she is having intense pain and feeling nauseated and has a pulse of 150 and feels like she can faint.  I advised Maralyn Sago and she agrees that patient needs to go to the ER immediately!   I called them back and let Worthy Rancher know and she stated that she has already call EMS and they are on their way. Worthy Rancher verbalized an understanding.

## 2020-11-30 LAB — URINE CULTURE

## 2020-12-05 ENCOUNTER — Ambulatory Visit (INDEPENDENT_AMBULATORY_CARE_PROVIDER_SITE_OTHER): Payer: Medicaid Other | Admitting: Internal Medicine

## 2020-12-09 ENCOUNTER — Encounter (INDEPENDENT_AMBULATORY_CARE_PROVIDER_SITE_OTHER): Payer: Self-pay | Admitting: Internal Medicine

## 2020-12-09 ENCOUNTER — Ambulatory Visit (INDEPENDENT_AMBULATORY_CARE_PROVIDER_SITE_OTHER): Payer: Medicaid Other | Admitting: Internal Medicine

## 2020-12-09 ENCOUNTER — Other Ambulatory Visit: Payer: Self-pay

## 2020-12-09 VITALS — BP 122/78 | HR 103 | Temp 97.2°F | Ht 64.0 in | Wt 184.2 lb

## 2020-12-09 DIAGNOSIS — E785 Hyperlipidemia, unspecified: Secondary | ICD-10-CM

## 2020-12-09 DIAGNOSIS — R748 Abnormal levels of other serum enzymes: Secondary | ICD-10-CM | POA: Diagnosis not present

## 2020-12-09 NOTE — Progress Notes (Signed)
Metrics: Intervention Frequency ACO  Documented Smoking Status Yearly  Screened one or more times in 24 months  Cessation Counseling or  Active cessation medication Past 24 months  Past 24 months   Guideline developer: UpToDate (See UpToDate for funding source) Date Released: 2014       Wellness Office Visit  Subjective:  Patient ID: Monica Jennings, female    DOB: 13-May-1996  Age: 25 y.o. MRN: [id number]  CC: This lady comes in for follow-up.  She was recently in the emergency room with abdominal pain. HPI  It was felt that the abdominal pain may have been due to uncontrolled diabetes and she seems to be improved.  She has been seeing an endocrinologist in [place] and she is doing much better now.  She is on a small dose of insulin but she has changed her diet and is avoiding sodas and unhealthy foods and this has helped her tremendously. When she was in the emergency room, her liver enzymes were significantly elevated compared to previous.  Ultrasound in the past in November 2021 did show the presence of fatty liver disease which may account for the elevation in liver enzymes. She also has a history of dyslipidemia with especially hypertriglyceridemia and low HDL levels. Past Medical History:  Diagnosis Date  . ADHD (attention deficit hyperactivity disorder)   . Diabetes (HCC)   . Hyperlipidemia   . Hypertension   . Migraines   . Vitamin D deficiency    Past Surgical History:  Procedure Laterality Date  . NO PAST SURGERIES       Family History  Problem Relation Age of Onset  . ADD / ADHD Brother   . Diabetes Maternal Grandfather   . Hypertension Maternal Grandfather     Social History   Social History Narrative  . Not on file   Social History   Tobacco Use  . Smoking status: Never Smoker  . Smokeless tobacco: Never Used  Substance Use Topics  . Alcohol use: No    Current Meds  Medication Sig  . albuterol (VENTOLIN HFA) 108 (90 Base) MCG/ACT inhaler  Inhale 1-2 puffs into the lungs every 6 (six) hours as needed for wheezing or shortness of breath (cough).  . cholecalciferol (VITAMIN D3) 25 MCG (1000 UT) tablet Take 5,000 Units by mouth daily.  . Continuous Blood Gluc Transmit (DEXCOM G6 TRANSMITTER) MISC 1 Device by Does not apply route every 3 (three) months.  . famotidine (PEPCID) 10 MG tablet Take 10 mg by mouth 2 (two) times daily.  . folic acid (FOLVITE) 400 MCG tablet Take 400 mcg by mouth daily.  Marland Kitchen glipiZIDE (GLUCOTROL) 10 MG tablet Take 1 tablet (10 mg total) by mouth 2 (two) times daily before a meal.  . IBU 800 MG tablet Take 800 mg by mouth every 8 (eight) hours.  Marland Kitchen ibuprofen (ADVIL,MOTRIN) 400 MG tablet Take 1 tablet (400 mg total) by mouth every 6 (six) hours as needed.  . insulin glargine (LANTUS SOLOSTAR) 100 UNIT/ML Solostar Pen Inject 12 Units into the skin daily.  . Insulin Pen Needle 32G X 4 MM MISC Use 1x a day  . metFORMIN (GLUCOPHAGE) 1000 MG tablet Take 1 tablet (1,000 mg total) by mouth 2 (two) times daily with a meal.     Flowsheet Row Office Visit from 11/13/2020 in [facility]  PHQ-9 Total Score 11      Objective:   Today's Vitals: BP 122/78   Pulse (!) 103   Temp (!) 97.2  F (36.2 C) (Temporal)   Ht 5\' 4"  (1.626 m)   Wt 184 lb 3.2 oz (83.6 kg)   LMP 11/10/2020   SpO2 96%   BMI 31.62 kg/m  Vitals with BMI 12/09/2020 11/28/2020 11/28/2020  Height 5\' 4"  - -  Weight 184 lbs 3 oz - -  BMI 31.6 - -  Systolic 122 123 11/30/2020  Diastolic 78 70 81  Pulse 103 124 121     Physical Exam   She looks systemically well.  Blood pressure is acceptable.  No new physical findings.    Assessment   1. Elevated liver enzymes   2. Dyslipidemia       Tests ordered Orders Placed This Encounter  Procedures  . COMPLETE METABOLIC PANEL WITH GFR  . Hepatitis panel, acute  . Lipid panel     Plan: 1. We will repeat the liver enzymes and also I will add a hepatitis panel. 2. Check lipid  panel. 3. Continue with medications for diabetes and follow with endocrinology. 4. She already has an appointment with in July.   No orders of the defined types were placed in this encounter.   Maralyn Sago, MD

## 2020-12-10 LAB — COMPLETE METABOLIC PANEL WITH GFR
AG Ratio: 1.4 (calc) (ref 1.0–2.5)
ALT: 164 U/L — ABNORMAL HIGH (ref 6–29)
AST: 99 U/L — ABNORMAL HIGH (ref 10–30)
Albumin: 4.2 g/dL (ref 3.6–5.1)
Alkaline phosphatase (APISO): 56 U/L (ref 31–125)
BUN: 11 mg/dL (ref 7–25)
CO2: 24 mmol/L (ref 20–32)
Calcium: 9.4 mg/dL (ref 8.6–10.2)
Chloride: 103 mmol/L (ref 98–110)
Creat: 0.68 mg/dL (ref 0.50–1.10)
GFR, Est African American: 142 mL/min/{1.73_m2} (ref >=60)
GFR, Est Non African American: 122 mL/min/{1.73_m2} (ref >=60)
Globulin: 3 g/dL (calc) (ref 1.9–3.7)
Glucose, Bld: 146 mg/dL — ABNORMAL HIGH (ref 65–139)
Potassium: 4.5 mmol/L (ref 3.5–5.3)
Sodium: 140 mmol/L (ref 135–146)
Total Bilirubin: 0.4 mg/dL (ref 0.2–1.2)
Total Protein: 7.2 g/dL (ref 6.1–8.1)

## 2020-12-10 LAB — LIPID PANEL
Cholesterol: 126 mg/dL (ref <200)
HDL: 40 mg/dL — ABNORMAL LOW (ref >=50)
LDL Cholesterol (Calc): 61 mg/dL (calc)
Non-HDL Cholesterol (Calc): 86 mg/dL (calc) (ref <130)
Total CHOL/HDL Ratio: 3.2 (calc) (ref <5.0)
Triglycerides: 173 mg/dL — ABNORMAL HIGH (ref <150)

## 2020-12-10 LAB — HEPATITIS PANEL, ACUTE
Hep A IgM: NONREACTIVE
Hep B C IgM: NONREACTIVE
Hepatitis B Surface Ag: NONREACTIVE
Hepatitis C Ab: NONREACTIVE
SIGNAL TO CUT-OFF: 0.01 (ref <1.00)

## 2020-12-24 ENCOUNTER — Ambulatory Visit (INDEPENDENT_AMBULATORY_CARE_PROVIDER_SITE_OTHER): Payer: Medicaid Other | Admitting: Nurse Practitioner

## 2020-12-25 ENCOUNTER — Telehealth: Payer: Self-pay | Admitting: Nutrition

## 2020-12-25 ENCOUNTER — Ambulatory Visit: Payer: Medicaid Other | Admitting: Nutrition

## 2020-12-25 NOTE — Telephone Encounter (Signed)
VM left to return call to reschedule missed appointment.

## 2021-01-09 ENCOUNTER — Ambulatory Visit: Payer: Medicaid Other | Admitting: Internal Medicine

## 2021-01-09 ENCOUNTER — Other Ambulatory Visit: Payer: Self-pay

## 2021-01-09 ENCOUNTER — Encounter: Payer: Self-pay | Admitting: Internal Medicine

## 2021-01-09 VITALS — BP 140/98 | HR 109 | Ht 64.0 in | Wt 187.6 lb

## 2021-01-09 DIAGNOSIS — E1129 Type 2 diabetes mellitus with other diabetic kidney complication: Secondary | ICD-10-CM | POA: Diagnosis not present

## 2021-01-09 DIAGNOSIS — E785 Hyperlipidemia, unspecified: Secondary | ICD-10-CM

## 2021-01-09 DIAGNOSIS — E669 Obesity, unspecified: Secondary | ICD-10-CM

## 2021-01-09 MED ORDER — LANTUS SOLOSTAR 100 UNIT/ML ~~LOC~~ SOPN: 16.0000 [IU] | PEN_INJECTOR | Freq: Every day | SUBCUTANEOUS | 11 refills | Status: DC
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
## 2021-01-09 NOTE — Patient Instructions (Addendum)
Please stop at the lab.  Please continue: - Metformin 1000 mg 2x a day, with mealsly. - Glipizide 10 mg 2x a day before meals  Increase: - Lantus 16 units at bedtime  Please schedule an appt with Oran Rein with nutrition.  Please return in 3 months with your sugar log.

## 2021-01-10 LAB — HEPATIC FUNCTION PANEL
ALT: 100 U/L — ABNORMAL HIGH (ref 0–35)
AST: 65 U/L — ABNORMAL HIGH (ref 0–37)
Albumin: 4.4 g/dL (ref 3.5–5.2)
Alkaline Phosphatase: 57 U/L (ref 39–117)
Bilirubin, Direct: 0.1 mg/dL (ref 0.0–0.3)
Total Bilirubin: 0.4 mg/dL (ref 0.2–1.2)
Total Protein: 8.1 g/dL (ref 6.0–8.3)

## 2021-01-10 LAB — ANTI-ISLET CELL ANTIBODY: Islet Cell Ab: NEGATIVE

## 2021-01-17 LAB — ZNT8 ANTIBODIES: ZNT8 Antibodies: <10 U/mL (ref <15)

## 2021-01-17 LAB — C-PEPTIDE: C-Peptide: 5.29 ng/mL — ABNORMAL HIGH (ref 0.80–3.85)

## 2021-01-17 LAB — GLUTAMIC ACID DECARBOXYLASE AUTO ABS: Glutamic Acid Decarb Ab: <5 IU/mL (ref <5)

## 2021-01-17 LAB — GLUCOSE, FASTING: Glucose, Bld: 140 mg/dL — ABNORMAL HIGH (ref 65–99)

## 2021-02-12 ENCOUNTER — Ambulatory Visit: Payer: Medicaid Other | Admitting: Internal Medicine

## 2021-02-19 ENCOUNTER — Ambulatory Visit (INDEPENDENT_AMBULATORY_CARE_PROVIDER_SITE_OTHER): Payer: Medicaid Other | Admitting: Nurse Practitioner

## 2021-03-06 ENCOUNTER — Ambulatory Visit (INDEPENDENT_AMBULATORY_CARE_PROVIDER_SITE_OTHER): Payer: Medicaid Other | Admitting: Nurse Practitioner

## 2021-03-10 ENCOUNTER — Telehealth (INDEPENDENT_AMBULATORY_CARE_PROVIDER_SITE_OTHER): Payer: Self-pay
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
## 2021-03-11 NOTE — Telephone Encounter (Signed)
Pt called and said they did go to Urgent care and being treated .

## 2021-03-13 ENCOUNTER — Ambulatory Visit: Payer: Medicaid Other | Admitting: Dietician

## 2021-04-08 IMAGING — US US RENAL
1 series · 14 of 25 positions shown · non-contrast
Comparison: None.

CLINICAL DATA: Flank pain

EXAM:
RENAL / URINARY TRACT ULTRASOUND COMPLETE

[Series 1: us renal · 14 of 50 slices shown]
[im 1/50]
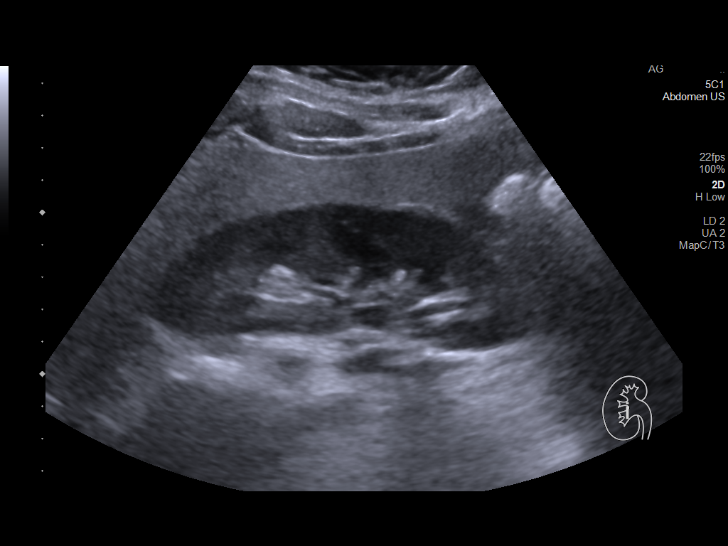
[im 5/50]
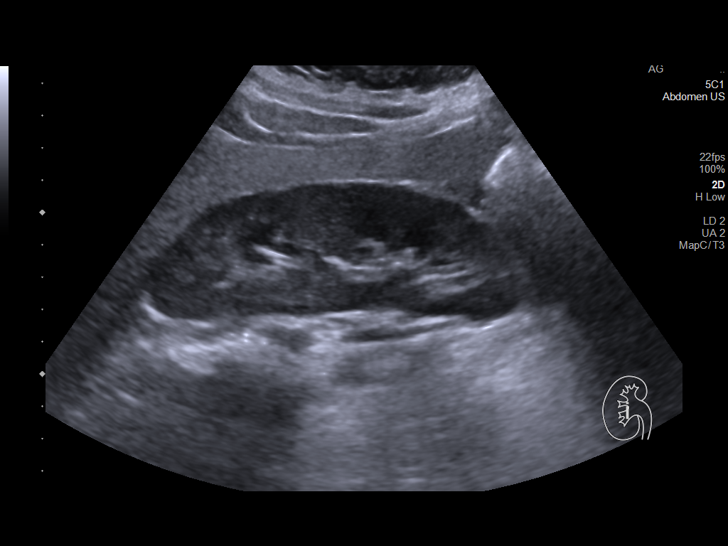
[im 9/50]
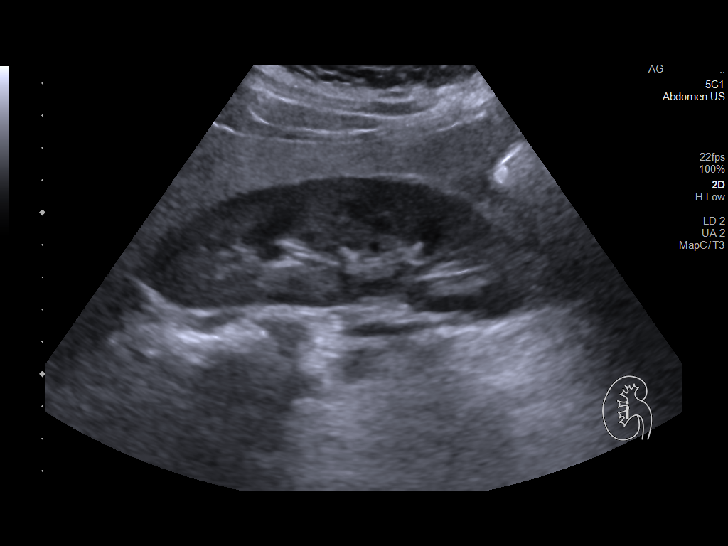
[im 13/50]
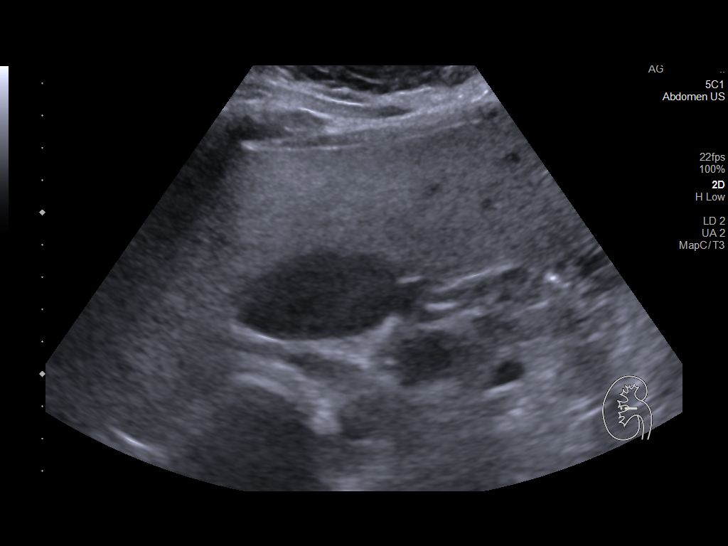
[im 17/50]
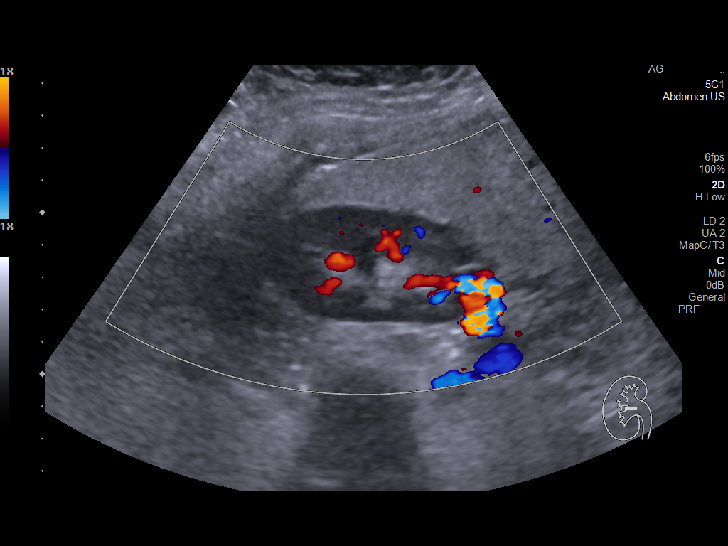
[im 19/50]
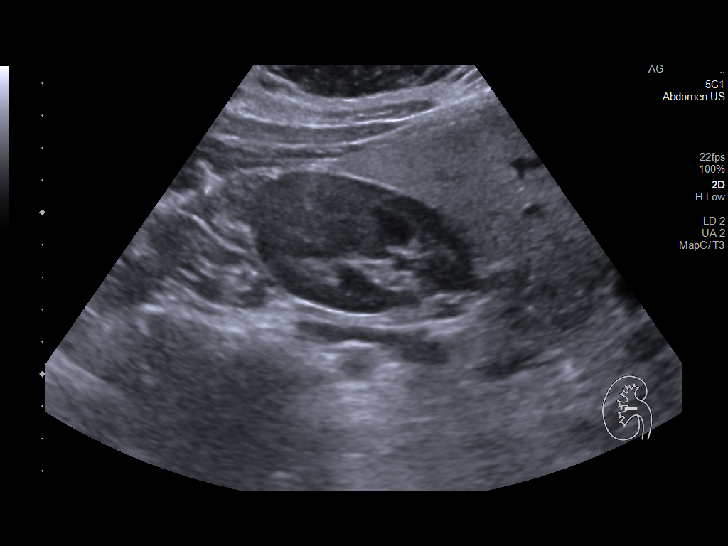
[im 23/50]
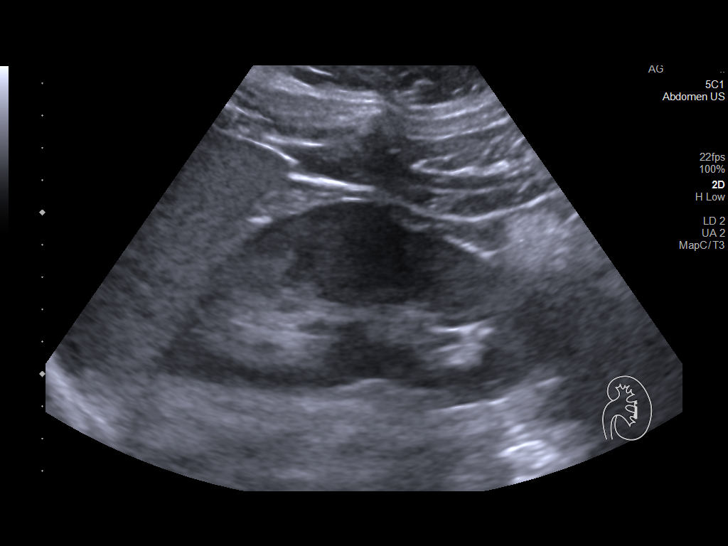
[im 27/50]
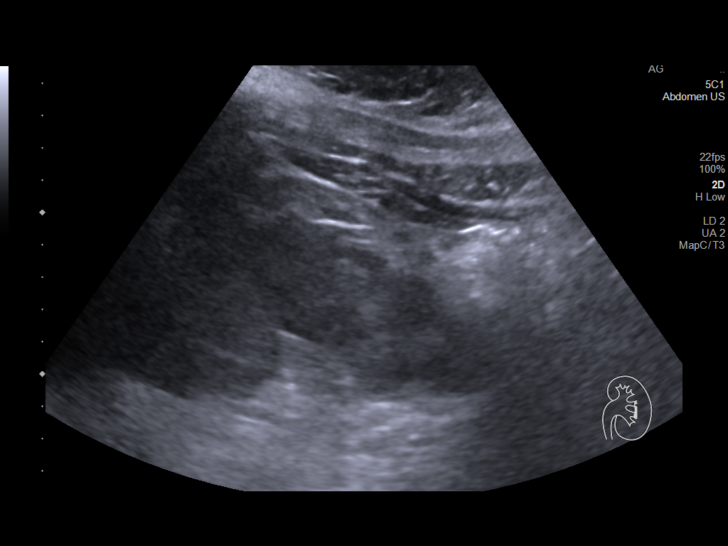
[im 31/50]
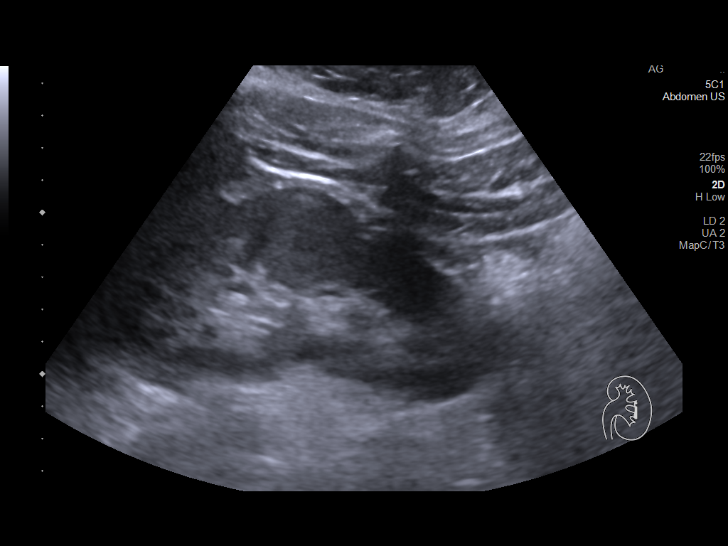
[im 33/50]
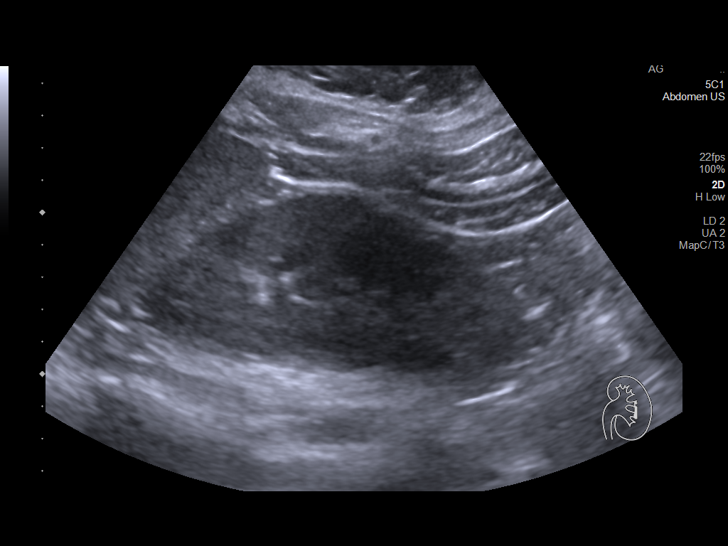
[im 37/50]
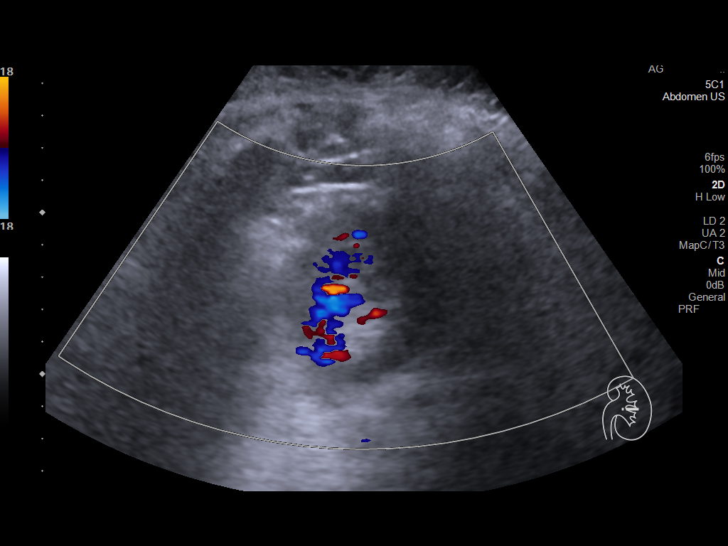
[im 41/50]
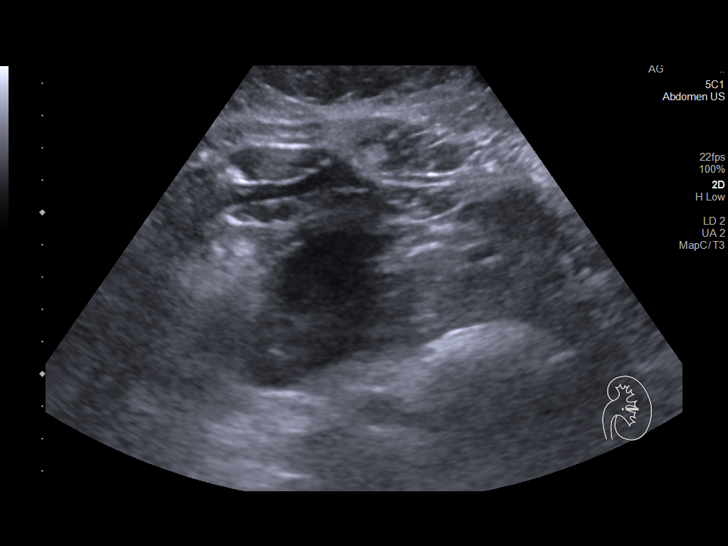
[im 45/50]
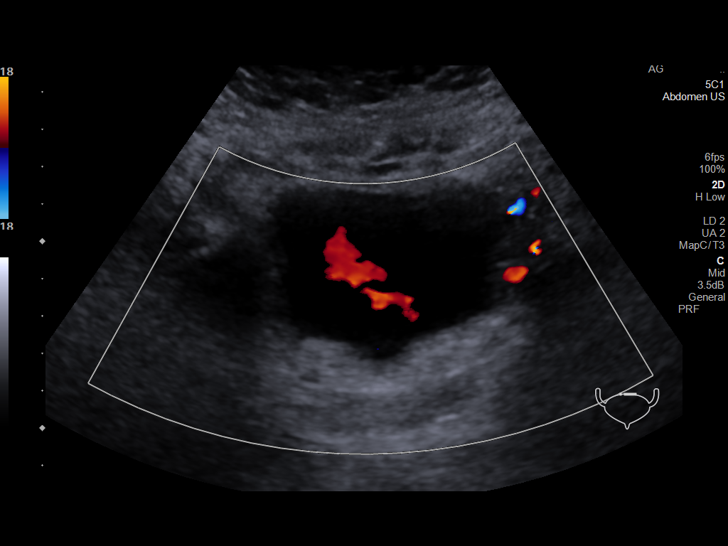
[im 50/50]
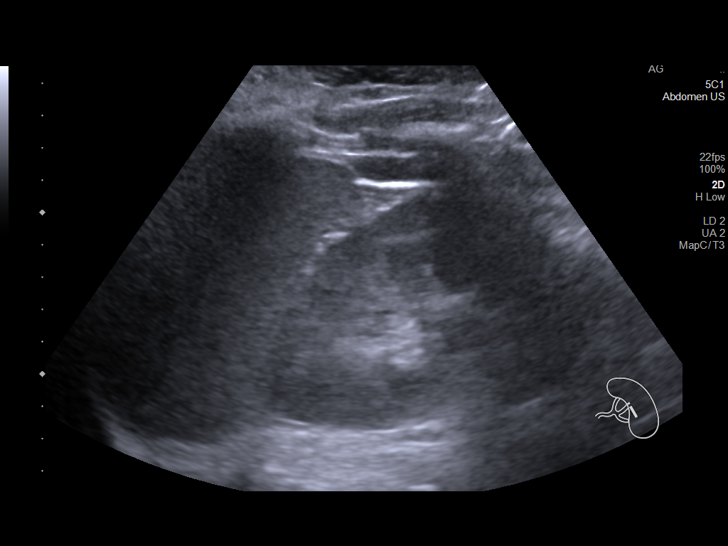

[14 of 25 positions shown; findings below may reference images not displayed]

FINDINGS: Right Kidney:

Renal measurements: 12.3 x 4.2 x 7.7 cm = volume: 208.5 mL.
Echogenicity and renal cortical thickness are within normal limits.
No mass, perinephric fluid, or hydronephrosis visualized. No
sonographically demonstrable calculus or ureterectasis.

Left Kidney:

Renal measurements: 12.4 x 5.7 x 4.9 cm = volume: 181.5 mL.
Echogenicity and renal cortical thickness are within normal limits.
No mass, perinephric fluid, or hydronephrosis visualized. No
sonographically demonstrable calculus or ureterectasis.

Bladder:

Appears normal for degree of bladder distention. Flow from each
distal ureter is seen in the bladder.

Other:

None.
IMPRESSION: Study within normal limits.

## 2021-04-11 ENCOUNTER — Ambulatory Visit: Payer: Medicaid Other | Admitting: Internal Medicine

## 2021-04-30 ENCOUNTER — Ambulatory Visit: Payer: Medicaid Other | Admitting: Internal Medicine

## 2021-04-30 ENCOUNTER — Other Ambulatory Visit: Payer: Self-pay

## 2021-04-30 ENCOUNTER — Encounter: Payer: Self-pay | Admitting: Internal Medicine

## 2021-04-30 VITALS — BP 138/88 | HR 103 | Ht 64.0 in | Wt 194.4 lb

## 2021-04-30 DIAGNOSIS — O24111 Pre-existing diabetes mellitus, type 2, in pregnancy, first trimester: Secondary | ICD-10-CM | POA: Diagnosis not present

## 2021-04-30 DIAGNOSIS — E669 Obesity, unspecified: Secondary | ICD-10-CM | POA: Diagnosis not present

## 2021-04-30 DIAGNOSIS — E785 Hyperlipidemia, unspecified: Secondary | ICD-10-CM

## 2021-04-30 MED ORDER — INSULIN PEN NEEDLE 32G X 4 MM MISC: 3 refills | Status: AC

## 2021-04-30 MED ORDER — INSULIN LISPRO (1 UNIT DIAL) 100 UNIT/ML (KWIKPEN): 4.0000 [IU] | PEN_INJECTOR | Freq: Three times a day (TID) | SUBCUTANEOUS | 11 refills | Status: DC

## 2021-04-30 NOTE — Patient Instructions (Addendum)
Please continue: - Metformin 1000 mg 2x a day, with mealsly. - Lantus 16 units at bedtime  Stop Glipizide and start: - Humalog: 4-6 units 15 min before meals  Pregnancy goals: - fasting <95 - 1h after a meal <140 - 2h after a meal <120  Please return in 1.5 months with your sugar log.
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
## 2021-05-08 ENCOUNTER — Encounter: Payer: Medicaid Other | Attending: Internal Medicine | Admitting: Dietician

## 2021-06-23 ENCOUNTER — Ambulatory Visit: Payer: Medicaid Other | Admitting: Internal Medicine

## 2021-07-14 ENCOUNTER — Ambulatory Visit: Payer: Medicaid Other | Admitting: Internal Medicine

## 2021-07-30 ENCOUNTER — Other Ambulatory Visit: Payer: Self-pay

## 2021-07-30 ENCOUNTER — Encounter: Payer: Self-pay | Admitting: Internal Medicine

## 2021-07-30 ENCOUNTER — Ambulatory Visit (INDEPENDENT_AMBULATORY_CARE_PROVIDER_SITE_OTHER): Payer: Medicaid Other | Admitting: Internal Medicine

## 2021-07-30 VITALS — BP 128/72 | HR 108 | Ht 64.0 in | Wt 204.0 lb

## 2021-07-30 DIAGNOSIS — E785 Hyperlipidemia, unspecified: Secondary | ICD-10-CM | POA: Diagnosis not present

## 2021-07-30 DIAGNOSIS — E669 Obesity, unspecified: Secondary | ICD-10-CM | POA: Diagnosis not present

## 2021-07-30 DIAGNOSIS — E1129 Type 2 diabetes mellitus with other diabetic kidney complication: Secondary | ICD-10-CM | POA: Diagnosis not present

## 2021-07-30 MED ORDER — METFORMIN HCL 1000 MG PO TABS: 1000.0000 mg | ORAL_TABLET | Freq: Two times a day (BID) | ORAL | 3 refills | Status: DC
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
## 2021-07-30 NOTE — Patient Instructions (Addendum)
Please continue: - Metformin 1000 mg 2x a day, with meals  Please increase: - Lantus 18 units at bedtime - Humalog 4 to 6 units before each meal  Pregnancy goals: - fasting <95 - 1h after a meal <140 - 2h after a meal <120  Please schedule an appt with Oran Rein with nutrition.  Please return in 1.5 months with your sugar log.

## 2021-09-11 ENCOUNTER — Ambulatory Visit: Payer: Medicaid Other | Admitting: Internal Medicine

## 2021-09-26 ENCOUNTER — Encounter: Payer: Self-pay | Admitting: Internal Medicine

## 2021-09-26 ENCOUNTER — Other Ambulatory Visit: Payer: Self-pay

## 2021-09-26 ENCOUNTER — Encounter: Payer: Self-pay | Admitting: Dietician

## 2021-09-26 ENCOUNTER — Encounter: Payer: Medicaid Other | Attending: Internal Medicine | Admitting: Dietician

## 2021-09-26 ENCOUNTER — Ambulatory Visit: Payer: Medicaid Other | Admitting: Internal Medicine

## 2021-09-26 VITALS — BP 118/82 | HR 108 | Ht 64.0 in | Wt 209.8 lb

## 2021-09-26 DIAGNOSIS — E1129 Type 2 diabetes mellitus with other diabetic kidney complication: Secondary | ICD-10-CM | POA: Insufficient documentation

## 2021-09-26 DIAGNOSIS — E669 Obesity, unspecified: Secondary | ICD-10-CM

## 2021-09-26 DIAGNOSIS — E785 Hyperlipidemia, unspecified: Secondary | ICD-10-CM | POA: Diagnosis not present

## 2021-09-26 DIAGNOSIS — O24113 Pre-existing diabetes mellitus, type 2, in pregnancy, third trimester: Secondary | ICD-10-CM

## 2021-09-26 LAB — POCT GLYCOSYLATED HEMOGLOBIN (HGB A1C): Hemoglobin A1C: 6.8 % — AB (ref 4.0–5.6)

## 2021-09-26 NOTE — Patient Instructions (Addendum)
Call your insurance to see if you can get a CGM (FreeStyle [place] or Dexcom).  Ezekiel bread Add ground flaxseed to your oatmeal Lower fat breakfast sandwich Increase the number of vegetables.  Aim for 3-4 Carb Choices per meal (45-60 grams) +/- 1 either way  Aim for 0-1 (15 grams) Carbs per snack if hungry    Eat more Non-Starchy Vegetables These include greens, broccoli, cauliflower, cabbage, carrots, beets, eggplant, peppers, squash and others. Minimize added sugars and refined grains Rethink what you drink.  Choose beverages without added sugar.  Look for 0 carbs on the label. See the list of whole grains below.  Find alternatives to usual sweet treats. Choose whole foods over processed. Make simple meals at home more often than eating out.  Tips to increase fiber in your diet: (All plants have fiber.  Eat a variety. There are more than are on this list.) Slowly increase the amount of fiber you eat to 25-35 grams per day.  (More is fine if you tolerate it.) Fiber from whole grains, nuts and seeds Quinoa, 1/2 cup = 5 grams Bulgur, 1/2 cup = 4.1 grams Popcorn, 3 cups = 3.6 grams Whole Wheat Spaghetti, 1/2 cup = 3.2 grams Barley, 1/2 cup = 3 grams Oatmeal, 1/2 cup = 2 grams Whole Wheat English Muffin = 3 grams Corn, 1/2 cup = 2.1 grams Brown Rice, 1/2 cup = 1.8 grams Flax seeds, 1 Tablespoon = 2.8 grams Chia seeds, 1 Tablespoon = 11 grams Almonds, 1 ounce = 3.5 grams fiber Fiber from legumes Kidney beans, 1/2 cup 7.9 grams Lentils, 1/2 cup = 7.8 grams Pinto beans, 1/2 cup = 7.7 grams Black beans, 1/2 cup = 7.6 grams Lima beans, 1/2 cup 6.4 grams Chick peas, 1/2 cup = 5.3 grams Black eyed peas, 1/2 cup = 4 grams Fiber from fruits and vegetables Pear, 6 grams Apple. 3.3 grams Raspberries or Blackberries, 3/4 cup = 6 grams Strawberries or Blueberries, 1 cup = 3.4 grams Baked sweet potato 3.8 grams fiber Baked potato with skin 4.4 grams  Peas, 1/2 cup = 4.4 grams   Spinach, 1/2 cup cooked = 3.5 grams  Avocado, 1/2 = 5 grams

## 2021-09-26 NOTE — Progress Notes (Signed)
Medical Nutrition Therapy  Appointment Start time:  1100  Appointment End time:  1215 Patient is here today with her grandfather.  Primary concerns today: Patient states that she has a lot of problems with eating healthy and snacking.  She is eating for her cravings which is increased sweets and decreased vegetables.  She is [redacted] weeks gestation. She will be induced in 1 month. She is checking her blood glucose 2 times per day.  She states that her insurance does not cover one.  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 Preferred learning style: no preference indicated Learning readiness: ready   NUTRITION ASSESSMENT   Anthropometrics  64" 209 lbs   Clinical Medical Hx: Type 2 Diabetes (2019), microalbuminuria hx, HLD, HTN, heartburn Medications: Metformin, Lantus 18 units 1 HS and Humalog per sliding scale and patient states that she takes 10 units 15 minutes before meals, prenatal vitamins Labs: A1C 6.8% 09/26/2020 increased from 6.2% 07/22/2021 Cholesterol 126, HDL 40, LDL 61, Triglycerides 171 on 12/09/2020  Lifestyle & Dietary Hx Patient lives with her boyfriend and 2 yo. She does most of ;the shopping and he does the cooking. 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  Sleep: 5 hours, was on medication to help her sleep when she was younger Stress / self-care: worries a lot Current average weekly physical activity: walking most days increased with her job  24-Hr Dietary Recall First Meal: skips often OR instant oatmeal, fruit OR sweetened cereal, 2% or whole milk, fruit Snack: none Second Meal (12-1): spicy asian noodles and or occasional grilled chicken or chicken patty, broccoli with cheese, sweetened strawberries or fruit cup or regular jello Snack: anything sweet and snacks until dinner Third Meal: spaghetti with meat sauce and cheese, ham rollup with cheese Snack: occasional sweet or chips Beverages: water, zero sugar, zero sugar gatorade, whole milk (a  lot due to heartburn)  Estimated Energy Needs Minimum of 170 grams of carbohydrates spread throughout the day.  Based on hunger, satiety, blood glucose readings.   NUTRITION DIAGNOSIS  NB-1.1 Food and nutrition-related knowledge deficit As related to balance of carbohydrates, protein, and fat.  As evidenced by diet hx and patient report.   NUTRITION INTERVENTION  Nutrition education (E-1) on the following topics:  Nutrition quality of foods Fiber to help blood glucose control and constipation and benefits of ground flax seeds as well Choose whole grains rather than refined Less processed foods More vegetables (non-starchy) Glucose goals and testing frequency CGM options and qualifications Balance of carbohydrate and protein throughout the day Insulin timing Snacking options that have less carbohydrates and timing to prevent increased snacking before dinner   Handouts Provided Include  GERD nutrition therapy from AND Pre-existing Diabetes during pregnancy Nutrition handout Meal plan card Diabetes resources Snack list Label reading Virtual cooking class flyer  Learning Style & Readiness for Change Teaching method utilized: Visual & Auditory  Demonstrated degree of understanding via: Teach Back  Barriers to learning/adherence to lifestyle change: none  Goals Established by Pt Call your insurance to see if you can get a CGM (FreeStyle [place] or Dexcom).  Ezekiel bread Add ground flaxseed to your oatmeal Lower fat breakfast sandwich Increase the number of vegetables.  Aim for 3-4 Carb Choices per meal (45-60 grams) +/- 1 either way  Aim for 0-1 (15 grams) Carbs per snack if hungry   Eat more Non-Starchy Vegetables These include greens, broccoli, cauliflower, cabbage, carrots, beets, eggplant, peppers, squash and others. Minimize added sugars and refined  grains Rethink what you drink.  Choose beverages without added sugar.  Look for 0 carbs on the label. See the list of  whole grains below.  Find alternatives to usual sweet treats. Choose whole foods over processed. Make simple meals at home more often than eating out.   MONITORING & EVALUATION Dietary intake, weekly physical activity, and label reading in 2 months.  Next Steps  Patient is to call for questions.

## 2021-09-26 NOTE — Patient Instructions (Signed)
Please continue: - Metformin 1000 mg 2x a day, with meals - Lantus 18 units at bedtime  Change: - Humalog   4 units before a snack 6 units before a smaller meal 8 units before a regular meal May need 10 units before a larger meal  Pregnancy goals: - fasting <95 - 1h after a meal <140 - 2h after a meal <120  Please return in 1.5 months with your sugar log
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
## 2021-11-07 ENCOUNTER — Encounter: Payer: Self-pay | Admitting: Internal Medicine

## 2021-11-07 ENCOUNTER — Ambulatory Visit: Payer: Medicaid Other | Admitting: Internal Medicine

## 2021-11-07 VITALS — BP 128/100 | HR 92 | Ht 64.0 in | Wt 187.4 lb

## 2021-11-07 DIAGNOSIS — E782 Mixed hyperlipidemia: Secondary | ICD-10-CM | POA: Diagnosis not present

## 2021-11-07 DIAGNOSIS — E669 Obesity, unspecified: Secondary | ICD-10-CM | POA: Diagnosis not present

## 2021-11-07 DIAGNOSIS — E1129 Type 2 diabetes mellitus with other diabetic kidney complication: Secondary | ICD-10-CM

## 2021-11-07 MED ORDER — METFORMIN HCL 1000 MG PO TABS: 1000.0000 mg | ORAL_TABLET | Freq: Every day | ORAL | 3 refills | Status: AC
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
## 2021-11-07 NOTE — Patient Instructions (Addendum)
Please decrease: ?- Metformin 1000 mg 1x a day ? ?Please return in 5 months with your sugar log ?

## 2021-11-21 ENCOUNTER — Encounter: Payer: Medicaid Other | Attending: Internal Medicine | Admitting: Dietician

## 2021-11-21 DIAGNOSIS — E1129 Type 2 diabetes mellitus with other diabetic kidney complication: Secondary | ICD-10-CM | POA: Insufficient documentation

## 2022-04-10 ENCOUNTER — Ambulatory Visit: Payer: Medicaid Other | Admitting: Internal Medicine

## 2022-06-19 ENCOUNTER — Ambulatory Visit: Payer: Medicaid Other | Admitting: Internal Medicine
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
